# Patient Record
Sex: Female | Born: 1980 | Race: White | Hispanic: No | Marital: Married | Smoking: Never smoker
Health system: Southern US, Community
[De-identification: ages and names within clinical notes are randomized; demographics above are authoritative.]

## PROBLEM LIST (undated history)

## (undated) DIAGNOSIS — J45909 Unspecified asthma, uncomplicated: Secondary | ICD-10-CM

## (undated) HISTORY — DX: Unspecified asthma, uncomplicated: J45.909

## (undated) HISTORY — PX: TYMPANOSTOMY TUBE PLACEMENT: SHX32

---
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
## 2004-10-15 ENCOUNTER — Other Ambulatory Visit: Admission: RE | Admit: 2004-10-15 | Discharge: 2004-10-15 | Payer: Self-pay | Admitting: Obstetrics and Gynecology

## 2005-12-09 ENCOUNTER — Other Ambulatory Visit: Admission: RE | Admit: 2005-12-09 | Discharge: 2005-12-09 | Payer: Self-pay | Admitting: Obstetrics and Gynecology

## 2008-02-04 ENCOUNTER — Emergency Department (HOSPITAL_COMMUNITY): Admission: EM | Admit: 2008-02-04 | Discharge: 2008-02-04 | Payer: Self-pay | Admitting: Family Medicine

## 2010-11-13 ENCOUNTER — Telehealth: Payer: Self-pay | Admitting: Internal Medicine

## 2010-11-17 NOTE — Telephone Encounter (Signed)
Advised Pt she could be scheduled for CPE around 01/11/11.  Pt will call back to schedule.

## 2010-11-17 NOTE — Telephone Encounter (Signed)
I am happy to see her later this summer. Is that OK? Say maybe July?

## 2011-12-29 ENCOUNTER — Other Ambulatory Visit: Payer: Self-pay | Admitting: Obstetrics and Gynecology

## 2011-12-29 DIAGNOSIS — N6322 Unspecified lump in the left breast, upper inner quadrant: Secondary | ICD-10-CM

## 2011-12-31 ENCOUNTER — Ambulatory Visit
Admission: RE | Admit: 2011-12-31 | Discharge: 2011-12-31 | Disposition: A | Discharge status: Home / Self Care | Payer: BC Managed Care – PPO | Source: Ambulatory Visit | Attending: Obstetrics and Gynecology | Admitting: Obstetrics and Gynecology

## 2011-12-31 DIAGNOSIS — N6322 Unspecified lump in the left breast, upper inner quadrant: Secondary | ICD-10-CM

## 2012-01-04 ENCOUNTER — Other Ambulatory Visit: Payer: Self-pay

## 2015-01-21 ENCOUNTER — Ambulatory Visit: Payer: Self-pay | Admitting: Internal Medicine

## 2015-01-21 ENCOUNTER — Ambulatory Visit: Payer: Self-pay

## 2015-06-20 ENCOUNTER — Encounter: Payer: Self-pay | Admitting: Family Medicine

## 2015-06-20 ENCOUNTER — Ambulatory Visit (HOSPITAL_BASED_OUTPATIENT_CLINIC_OR_DEPARTMENT_OTHER)
Admission: RE | Admit: 2015-06-20 | Discharge: 2015-06-20 | Disposition: A | Discharge status: Home / Self Care | Payer: BLUE CROSS/BLUE SHIELD | Source: Ambulatory Visit | Attending: Family Medicine | Admitting: Family Medicine

## 2015-06-20 ENCOUNTER — Ambulatory Visit (INDEPENDENT_AMBULATORY_CARE_PROVIDER_SITE_OTHER): Payer: BLUE CROSS/BLUE SHIELD | Admitting: Family Medicine

## 2015-06-20 VITALS — BP 125/84 | HR 45 | Ht 66.0 in | Wt 140.0 lb

## 2015-06-20 DIAGNOSIS — M25511 Pain in right shoulder: Secondary | ICD-10-CM

## 2015-06-20 DIAGNOSIS — S4991XA Unspecified injury of right shoulder and upper arm, initial encounter: Secondary | ICD-10-CM | POA: Diagnosis not present

## 2015-06-20 NOTE — Patient Instructions (Signed)
I'm worried about your recurrent instability and a labral tear in your shoulder. Wear sling and avoid external rotation, overhead motions as much as possible. Icing, ibuprofen or aleve as needed for pain. Ok to take tylenol in addition to this. We will go ahead with an MRI arthrogram of your shoulder.

## 2015-06-24 DIAGNOSIS — S4991XA Unspecified injury of right shoulder and upper arm, initial encounter: Secondary | ICD-10-CM | POA: Insufficient documentation

## 2015-06-24 NOTE — Progress Notes (Addendum)
PCP: No primary care provider on file.  Subjective:   HPI: Patient is a 35 y.o. female here for right shoulder injury.  Patient reports about 3 weeks ago she was doing cross fit and started to get soreness, instability in right shoulder. Since that times has had several instances where the right shoulder pops out anteriorly and has to be pushed back into place. Pain level 4/10, sharp anterior and deep. Motion is limited. No prior issues with this shoulder. Is left handed. No skin changes, fever, other complaints.  No past medical history on file.  No current outpatient prescriptions on file prior to visit.   No current facility-administered medications on file prior to visit.    No past surgical history on file.  Allergies  Allergen Reactions  . Iodine   . Penicillins     Social History   Social History  . Marital Status: Single    Spouse Name: N/A  . Number of Children: N/A  . Years of Education: N/A   Occupational History  . Not on file.   Social History Main Topics  . Smoking status: Never Smoker   . Smokeless tobacco: Not on file  . Alcohol Use: Not on file  . Drug Use: Not on file  . Sexual Activity: Not on file   Other Topics Concern  . Not on file   Social History Narrative  . No narrative on file    No family history on file.  BP 125/84 mmHg  Pulse 45  Ht 5\' 6"  (1.676 m)  Wt 140 lb (63.504 kg)  BMI 22.61 kg/m2  LMP 06/15/2015  Review of Systems: See HPI above.    Objective:  Physical Exam:  Gen: NAD  Right shoulder: No swelling, ecchymoses.  No gross deformity. TTP anteriorly with less over AC joint.  No other tenderness. Abduction and flexion to 150 degrees.  Full ER and IR. Mild pain with Hawkins, negative Neers. Negative Speeds, Yergasons. Strength 5/5 with empty can and resisted internal/external rotation. Positive apprehension. Positive o'briens. Positive sulcus NV intact distally.  Left shoulder: FROM without pain.   Positive sulcus only.    Assessment & Plan:  1. Right shoulder injury - 2/2 recurrent subluxations and probable labral tear with very unstable shoulder.  Radiographs negative for bony bankart or hill-sachs lesion.  Advised given level of instability I do not think she would do well with conservative measures though offered them as an option (sling followed by physical therapy).  We will go ahead with MRI arthrogram to further assess.  Sling, icing, nsaids in meantime.  Addendum:  MRI reviewed and discussed with patient.  No evidence Bankart or labral tear.  She has torn posterior band of inferior glenohumeral ligament.  She should recover well with conservative treatment, strengthening rotator cuff.  She will start formal physical therapy and home exercises.  F/u in 4 weeks after starting this.

## 2015-06-24 NOTE — Assessment & Plan Note (Signed)
2/2 recurrent subluxations and probable labral tear with very unstable shoulder.  Radiographs negative for bony bankart or hill-sachs lesion.  Advised given level of instability I do not think she would do well with conservative measures though offered them as an option (sling followed by physical therapy).  We will go ahead with MRI arthrogram to further assess.  Sling, icing, nsaids in meantime.

## 2015-06-25 ENCOUNTER — Telehealth: Payer: Self-pay | Admitting: Family Medicine

## 2015-06-25 NOTE — Telephone Encounter (Signed)
The pain can cause nausea, yes.  But it's also possible she has two separate things going on - labral tear of her shoulder plus something else (there is a GI virus going around now for example; pregnancy can cause nausea, several other things too).

## 2015-06-26 NOTE — Telephone Encounter (Signed)
Spoke to patient on 06-25-15 and gave her information provided by physician.

## 2015-07-02 ENCOUNTER — Ambulatory Visit
Admission: RE | Admit: 2015-07-02 | Discharge: 2015-07-02 | Disposition: A | Discharge status: Home / Self Care | Payer: BLUE CROSS/BLUE SHIELD | Source: Ambulatory Visit | Attending: Family Medicine | Admitting: Family Medicine

## 2015-07-02 DIAGNOSIS — M25511 Pain in right shoulder: Secondary | ICD-10-CM

## 2015-07-07 ENCOUNTER — Other Ambulatory Visit: Payer: BLUE CROSS/BLUE SHIELD

## 2015-07-07 ENCOUNTER — Telehealth: Payer: Self-pay | Admitting: Family Medicine

## 2015-07-07 MED ORDER — NITROGLYCERIN 0.2 MG/HR TD PT24: MEDICATED_PATCH | TRANSDERMAL | Status: DC

## 2015-07-07 NOTE — Telephone Encounter (Signed)
Ok - referral placed by [employer] and signed.  Thanks!

## 2015-07-07 NOTE — Telephone Encounter (Signed)
Spoke to patient and notified her that prescription (Nitro) has been sent to her pharmacy.

## 2015-07-07 NOTE — Telephone Encounter (Signed)
Nitro sent. Please notify patient.  Thanks!

## 2015-09-04 ENCOUNTER — Ambulatory Visit (INDEPENDENT_AMBULATORY_CARE_PROVIDER_SITE_OTHER): Payer: BLUE CROSS/BLUE SHIELD | Admitting: Family Medicine

## 2015-09-04 VITALS — BP 106/62 | HR 52 | Ht 66.0 in | Wt 140.0 lb

## 2015-09-04 DIAGNOSIS — M25462 Effusion, left knee: Secondary | ICD-10-CM | POA: Diagnosis not present

## 2015-09-04 NOTE — Patient Instructions (Signed)
You have a synovitis of your knee. It's very unlikely for you have a meniscus tear in your knee based on your history and exam. Icing 15 minutes at a time 3-4 times a day. ACE wrap or compression sleeve. Elevate above your heart as much as possible. Ibuprofen 800mg  three times a day with food OR aleve 2 tabs twice a day with food for pain and inflammation for 7-10 days then as needed. Avoid deep squats, deep lunges, leg press. Consider waiting 2 weeks before running as this has been bothering it. If not improving would consider aspiration and injection of your knee. Follow up with me in 1 month but call me sooner with any questions or concerns.

## 2015-09-10 ENCOUNTER — Encounter: Payer: Self-pay | Admitting: Family Medicine

## 2015-09-10 DIAGNOSIS — M25462 Effusion, left knee: Secondary | ICD-10-CM | POA: Insufficient documentation

## 2015-09-10 NOTE — Assessment & Plan Note (Signed)
exam reassuring.  Consistent with simple synovitis of the knee.  No redness, minimal pain and full motion.  Doubt gout, rheumatoid arthritis as causes.  Encouraged continued conservative treatment - icing, ace wrap, elevation, regular nsaids.  F/u in 1 month.  Consider aspiration and injection if not improving over next couple of weeks though.

## 2015-09-10 NOTE — Progress Notes (Signed)
PCP: No primary care provider on file.  Subjective:   HPI: Patient is a 35 y.o. female here for left knee pain.  Patient denies known injury or trauma. Started to notice anterior left knee pain and swelling after a run 3 weeks ago. Has had some soreness in knees past couple years off and on but never needed treatment. Denies clicking, catching, locking, giving out. Pain can be sharp, up to 5/10 level. Has been icing, elevating, taking ibuprofen. No skin changes, fever.  No past medical history on file.  Current Outpatient Prescriptions on File Prior to Visit  Medication Sig Dispense Refill  . albuterol (PROVENTIL HFA;VENTOLIN HFA) 108 (90 Base) MCG/ACT inhaler Inhale into the lungs every 6 (six) hours as needed for wheezing or shortness of breath.    . nitroGLYCERIN (NITRODUR - DOSED IN MG/24 HR) 0.2 mg/hr patch Apply 1/4th patch to affected shoulder, change daily 30 patch 1   No current facility-administered medications on file prior to visit.    No past surgical history on file.  Allergies  Allergen Reactions  . Iodine   . Penicillins     Social History   Social History  . Marital Status: Single    Spouse Name: N/A  . Number of Children: N/A  . Years of Education: N/A   Occupational History  . Not on file.   Social History Main Topics  . Smoking status: Never Smoker   . Smokeless tobacco: Not on file  . Alcohol Use: Not on file  . Drug Use: Not on file  . Sexual Activity: Not on file   Other Topics Concern  . Not on file   Social History Narrative    No family history on file.  BP 106/62 mmHg  Pulse 52  Ht 5\' 6"  (1.676 m)  Wt 140 lb (63.504 kg)  BMI 22.61 kg/m2  Review of Systems: See HPI above.    Objective:  Physical Exam:  Gen: NAD, comfortable in exam room  Left knee: Mod effusion.  No other deformity, bruising. No TTP. FROM. Negative ant/post drawers. Negative valgus/varus testing. Negative lachmanns. Negative mcmurrays, apleys,  patellar apprehension. NV intact distally.  Right knee: FROM without pain.    Assessment & Plan:  1. Left knee effusion, pain - exam reassuring.  Consistent with simple synovitis of the knee.  No redness, minimal pain and full motion.  Doubt gout, rheumatoid arthritis as causes.  Encouraged continued conservative treatment - icing, ace wrap, elevation, regular nsaids.  F/u in 1 month.  Consider aspiration and injection if not improving over next couple of weeks though.

## 2017-03-13 IMAGING — DX DG SHOULDER 2+V*R*
3 series · 3 of 3 positions shown · non-contrast
Comparison: None.

CLINICAL DATA: 34-year-old female with right shoulder pain for 1
month following exercise related injury. Initial encounter.

EXAM:
RIGHT SHOULDER - 2+ VIEW

[shoulder grashey]
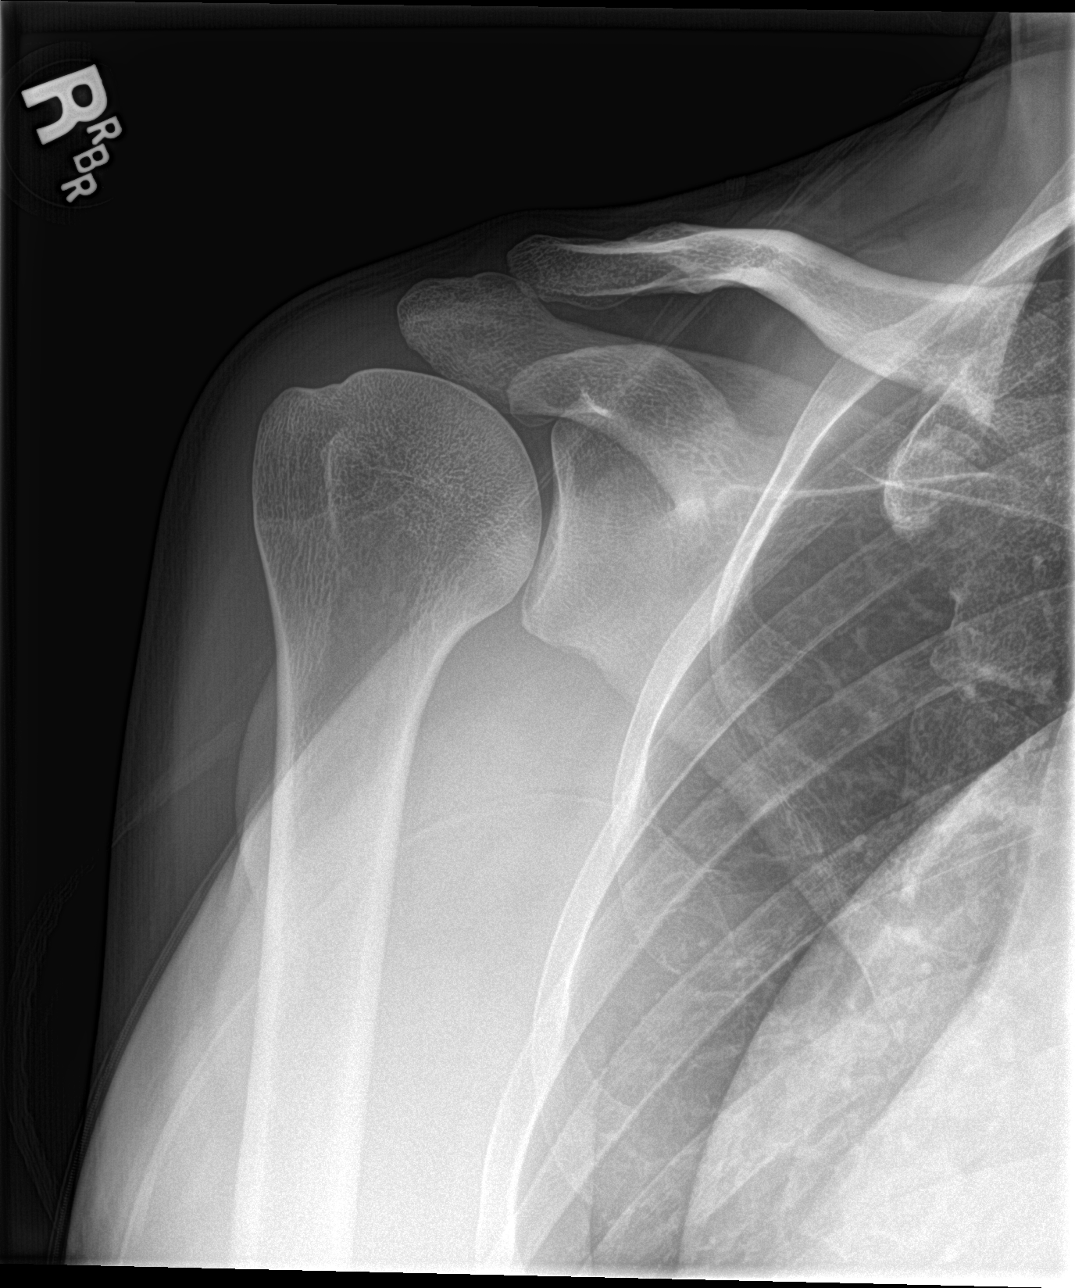

[shoulder axillary]
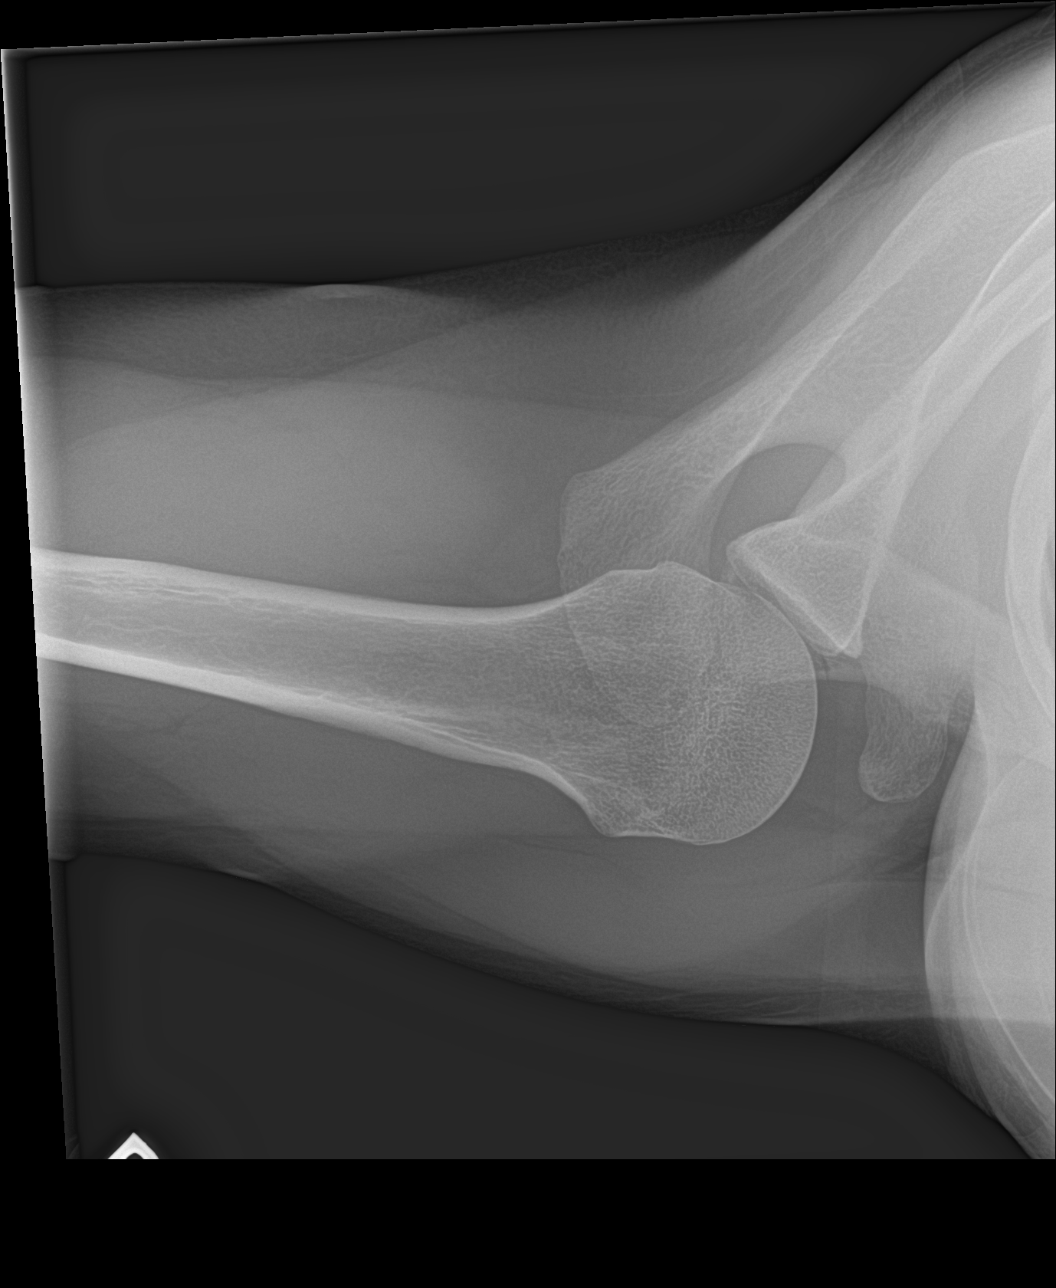

[shoulder y view]
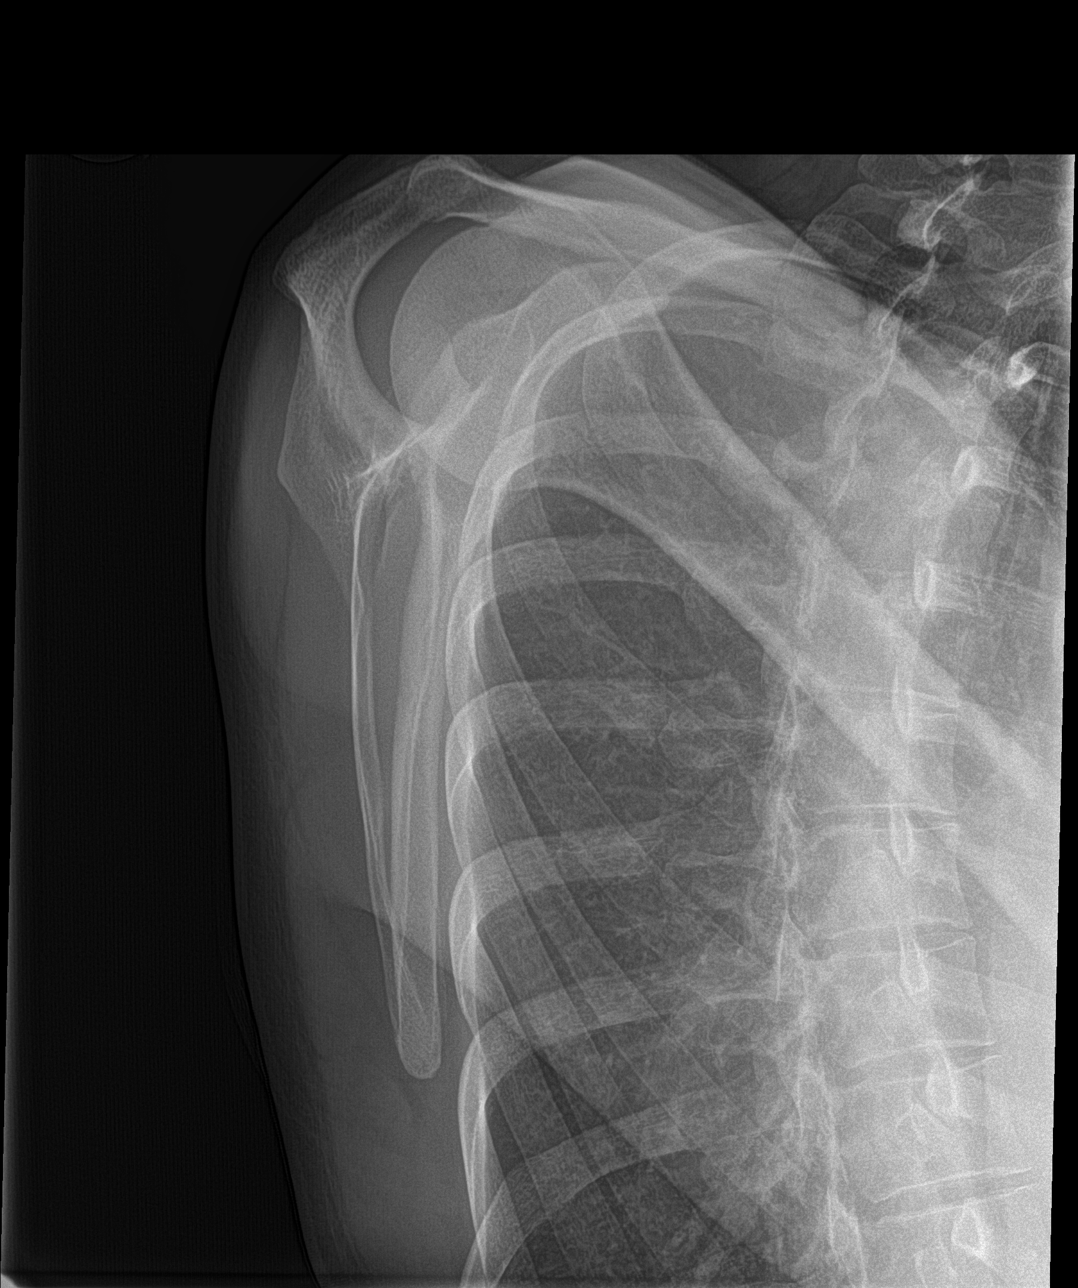

[3 of 3 positions shown; findings below may reference images not displayed]

FINDINGS: Bone mineralization is within normal limits. No glenohumeral joint
dislocation. Proximal right humerus intact. Visible right clavicle
and scapula appear intact. Negative visualized right ribs and lung
parenchyma. Incidental breast implants.
IMPRESSION: No osseous abnormality identified about the right shoulder.

## 2018-12-20 ENCOUNTER — Ambulatory Visit (HOSPITAL_BASED_OUTPATIENT_CLINIC_OR_DEPARTMENT_OTHER)
Admission: RE | Admit: 2018-12-20 | Discharge: 2018-12-20 | Disposition: A | Discharge status: Home / Self Care | Payer: PRIVATE HEALTH INSURANCE | Source: Ambulatory Visit | Attending: Family Medicine | Admitting: Family Medicine

## 2018-12-20 ENCOUNTER — Other Ambulatory Visit: Payer: Self-pay

## 2018-12-20 ENCOUNTER — Ambulatory Visit (INDEPENDENT_AMBULATORY_CARE_PROVIDER_SITE_OTHER): Payer: No Typology Code available for payment source | Admitting: Family Medicine

## 2018-12-20 ENCOUNTER — Encounter: Payer: Self-pay | Admitting: Family Medicine

## 2018-12-20 VITALS — BP 126/77 | HR 56 | Ht 65.0 in | Wt 140.0 lb

## 2018-12-20 DIAGNOSIS — M25552 Pain in left hip: Secondary | ICD-10-CM | POA: Diagnosis not present

## 2018-12-20 NOTE — Patient Instructions (Signed)
Nice to meet you Please try the exercises and stretches  I will call you with the results from today   Please send me a message in [place] with any questions or updates.  Please see me back in 4 weeks.   --Dr. Raeford Razor

## 2018-12-20 NOTE — Progress Notes (Signed)
Elaine Dorsey - 38 y.o. female MRN [id number]  Date of birth: May 21, 1981  SUBJECTIVE:  Including CC & ROS.  Chief Complaint  Patient presents with  . Hip Pain    left hip  . Back Pain    left-sided low back    Elaine Dorsey is a 38 y.o. female that is presenting with acute on chronic left hip pain.  She feels the pain over the left lateral hip as well as the lower back.  This is been ongoing for over a year now.  She feels the pain is becoming more intense and frequent.  She works out on a regular basis.  She does running as well as lifting weights.  She feels the pain the next day after running in this area.  Denies any specific inciting event or trauma.  Has not taken any medication.  The pain is becoming more constant.  The pain can be sharp.   Review of Systems  Constitutional: Negative for fever.  HENT: Negative for congestion.   Respiratory: Negative for cough.   Cardiovascular: Negative for chest pain.  Gastrointestinal: Negative for abdominal pain.  Musculoskeletal: Positive for back pain. Negative for gait problem.  Skin: Negative for color change.  Neurological: Negative for weakness.  Hematological: Negative for adenopathy.    HISTORY: Past Medical, Surgical, Social, and Family History Reviewed & Updated per EMR.   Pertinent Historical Findings include:  History reviewed. No pertinent past medical history.  History reviewed. No pertinent surgical history.  Allergies  Allergen Reactions  . Iodine   . Penicillins     History reviewed. No pertinent family history.   Social History   Socioeconomic History  . Marital status: Single    Spouse name: Not on file  . Number of children: Not on file  . Years of education: Not on file  . Highest education level: Not on file  Occupational History  . Not on file  Social Needs  . Financial resource strain: Not on file  . Food insecurity    Worry: Not on file    Inability: Not on file  .  Transportation needs    Medical: Not on file    Non-medical: Not on file  Tobacco Use  . Smoking status: Never Smoker  . Smokeless tobacco: Never Used  Substance and Sexual Activity  . Alcohol use: Not on file  . Drug use: Not on file  . Sexual activity: Not on file  Lifestyle  . Physical activity    Days per week: Not on file    Minutes per session: Not on file  . Stress: Not on file  Relationships  . Social Musicianconnections    Talks on phone: Not on file    Gets together: Not on file    Attends religious service: Not on file    Active member of club or organization: Not on file    Attends meetings of clubs or organizations: Not on file    Relationship status: Not on file  . Intimate partner violence    Fear of current or ex partner: Not on file    Emotionally abused: Not on file    Physically abused: Not on file    Forced sexual activity: Not on file  Other Topics Concern  . Not on file  Social History Narrative  . Not on file     PHYSICAL EXAM:  VS: BP 126/77   Pulse (!) 56   Ht 5\' 5"  (1.651 m)  Wt 140 lb (63.5 kg)   LMP 12/06/2018   BMI 23.30 kg/m  Physical Exam Gen: NAD, alert, cooperative with exam, well-appearing ENT: normal lips, normal nasal mucosa,  Eye: normal EOM, normal conjunctiva and lids CV:  no edema, +2 pedal pulses   Resp: no accessory muscle use, non-labored,   Skin: no rashes, no areas of induration  Neuro: normal tone, normal sensation to touch Psych:  normal insight, alert and oriented MSK:  Back/Left hip:  No leg length discrepancy. Normal internal and external rotation. Normal hip abduction strength. Negative straight leg raise. No snapping of the hip. TTP of the Si joint  Neurovascularly intact      ASSESSMENT & PLAN:   I spent 25 minutes with this patient, greater than 50% was face-to-face time counseling regarding the below diagnosis.   Left hip pain She has good strength with hip abduction.  Possible that she may have a  pincer or cam lesion.  Could be associated with the iliopsoas.  No specific injury but could be labral related. -X-ray - counseled on training  -Counseled on home exercise therapy and supportive care. -If no improvement consider physical therapy or injection.

## 2018-12-20 NOTE — Assessment & Plan Note (Addendum)
She has good strength with hip abduction.  Possible that she may have a pincer or cam lesion.  Could be associated with the iliopsoas.  No specific injury but could be labral related. -X-ray - counseled on training  -Counseled on home exercise therapy and supportive care. -If no improvement consider physical therapy or injection.

## 2018-12-21 ENCOUNTER — Telehealth: Payer: Self-pay | Admitting: Family Medicine

## 2018-12-21 NOTE — Addendum Note (Signed)
Addended by: Sherrie George F on: 12/21/2018 08:46 AM   Modules accepted: Orders

## 2018-12-21 NOTE — Telephone Encounter (Signed)
Left VM for patient. If she calls back please have her  speak with a nurse/CMA and inform that her xrays do not show any significant changes. I think giving physical therapy a try would be the next best step.   If any questions then please take the best time and phone number to call and I will try to call her back.   Rosemarie Ax, MD [facility] 12/21/2018, 8:20 AM

## 2019-09-14 ENCOUNTER — Ambulatory Visit: Payer: No Typology Code available for payment source | Attending: Internal Medicine

## 2019-09-14 DIAGNOSIS — Z23 Encounter for immunization: Secondary | ICD-10-CM

## 2019-09-14 NOTE — Progress Notes (Signed)
   Covid-19 Vaccination Clinic  Name:  Elaine Dorsey    MRN: [id number] DOB: Nov 17, 1980  09/14/2019  [name] was observed post Covid-19 immunization for 30 minutes based on pre-vaccination screening without incident. She was provided with Vaccine Information Sheet and instruction to access the V-Safe system.   [name] was instructed to call 911 with any severe reactions post vaccine: Marland Kitchen Difficulty breathing  . Swelling of face and throat  . A fast heartbeat  . A bad rash all over body  . Dizziness and weakness   Immunizations Administered    Name Date Dose VIS Date Route   Pfizer COVID-19 Vaccine 09/14/2019 12:08 PM 0.3 mL 06/01/2019 Intramuscular   Manufacturer: [employer], [employer]   Lot: [id number]   NDC: [address]-0

## 2019-10-08 ENCOUNTER — Ambulatory Visit: Payer: No Typology Code available for payment source | Attending: Internal Medicine

## 2019-10-08 DIAGNOSIS — Z23 Encounter for immunization: Secondary | ICD-10-CM

## 2019-10-08 NOTE — Progress Notes (Signed)
   Covid-19 Vaccination Clinic  Name:  Elaine Dorsey    MRN: [id number] DOB: 1981-04-18  10/08/2019  [name] was observed post Covid-19 immunization for 30 minutes based on pre-vaccination screening without incident. She was provided with Vaccine Information Sheet and instruction to access the V-Safe system.   [name] was instructed to call 911 with any severe reactions post vaccine: Marland Kitchen Difficulty breathing  . Swelling of face and throat  . A fast heartbeat  . A bad rash all over body  . Dizziness and weakness   Immunizations Administered    Name Date Dose VIS Date Route   Pfizer COVID-19 Vaccine 10/08/2019  4:24 PM 0.3 mL 08/15/2018 Intramuscular   Manufacturer: [employer], [employer]   Lot: [id number]   NDC: [address]-7

## 2020-02-15 ENCOUNTER — Other Ambulatory Visit: Payer: Self-pay | Admitting: Obstetrics and Gynecology

## 2020-02-15 DIAGNOSIS — N979 Female infertility, unspecified: Secondary | ICD-10-CM

## 2020-03-11 ENCOUNTER — Other Ambulatory Visit: Payer: No Typology Code available for payment source

## 2020-03-11 DIAGNOSIS — Z20822 Contact with and (suspected) exposure to covid-19: Secondary | ICD-10-CM

## 2020-03-13 LAB — SARS-COV-2, NAA 2 DAY TAT

## 2020-03-13 LAB — NOVEL CORONAVIRUS, NAA: SARS-CoV-2, NAA: NOT DETECTED

## 2020-04-11 ENCOUNTER — Telehealth: Payer: Self-pay

## 2020-04-11 NOTE — Telephone Encounter (Signed)
Received call from scheduling 04/07/20 to call in 13 hour prep for patient for HSG procedure on 04/14/20. 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 Per Dr. Eppie Gibson he was okay with the 13 hour prep as the contrast is injected into a local area not given through an IV. Spoke with patient on 10/18 regarding this and the pt did not feel comfortable receiving contrast at all, even with the 13 hour prep. Pt reported she would discuss with her OB and call [facility] back. Pt called today (04/11/20) to follow up and pt requested the appt/procedure to be canceled. [facility], in scheduling, canceled procedure.

## 2020-04-14 ENCOUNTER — Other Ambulatory Visit: Payer: No Typology Code available for payment source

## 2020-09-12 IMAGING — DX DG HIP (WITH OR WITHOUT PELVIS) 2-3V LEFT
3 series · 3 of 3 positions shown · non-contrast
Comparison: None.

CLINICAL DATA: Low back and left hip and leg pain.

EXAM:
DG HIP (WITH OR WITHOUT PELVIS) 2-3V LEFT

[pelvis ap]
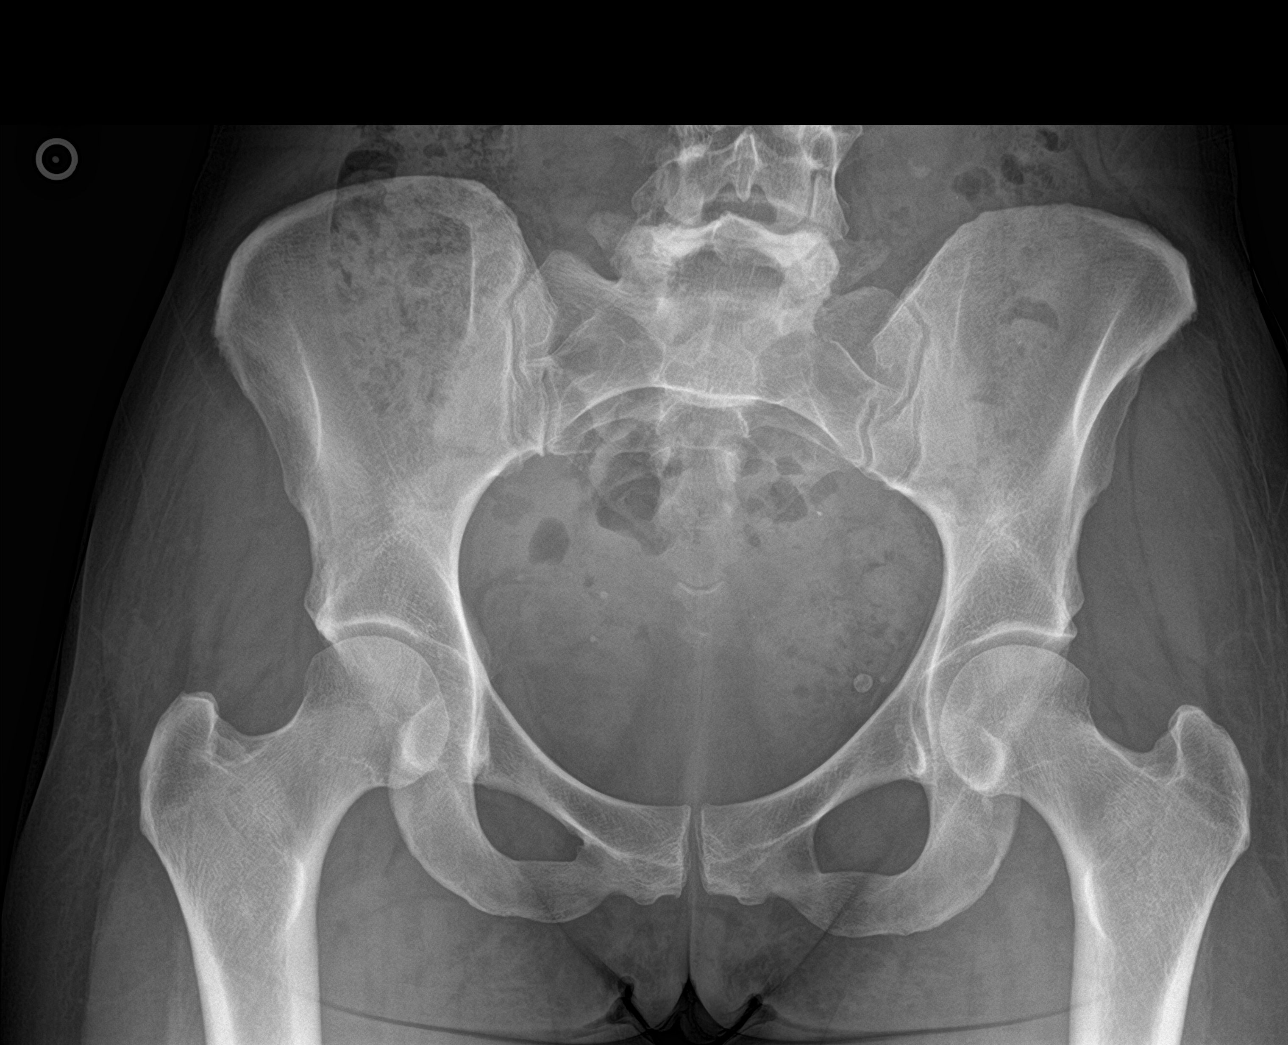

[hip ap]
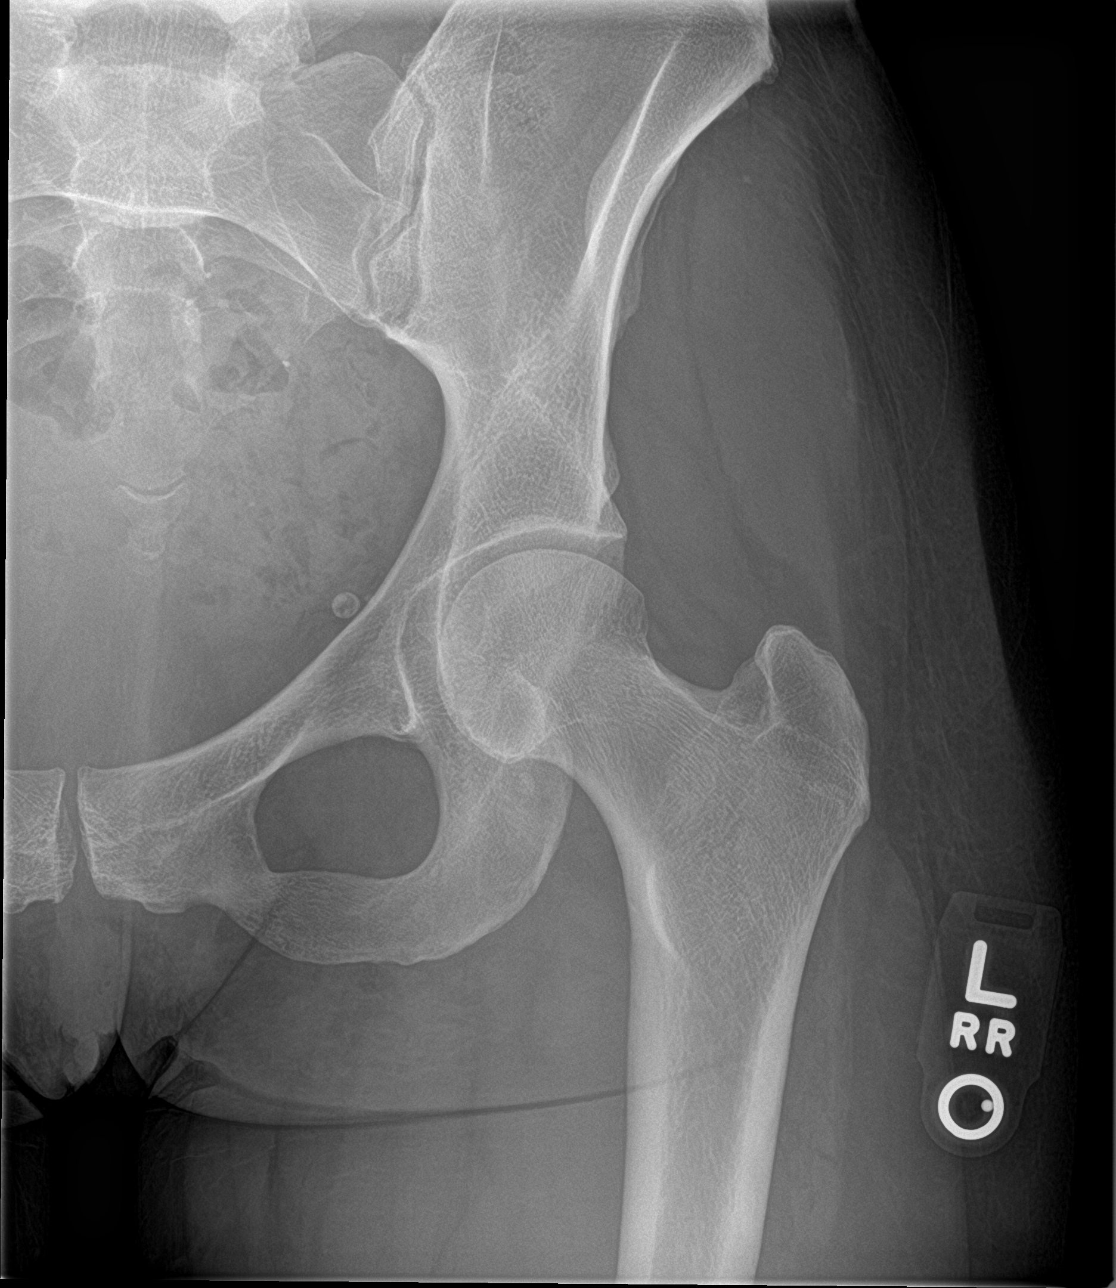

[hip lat]
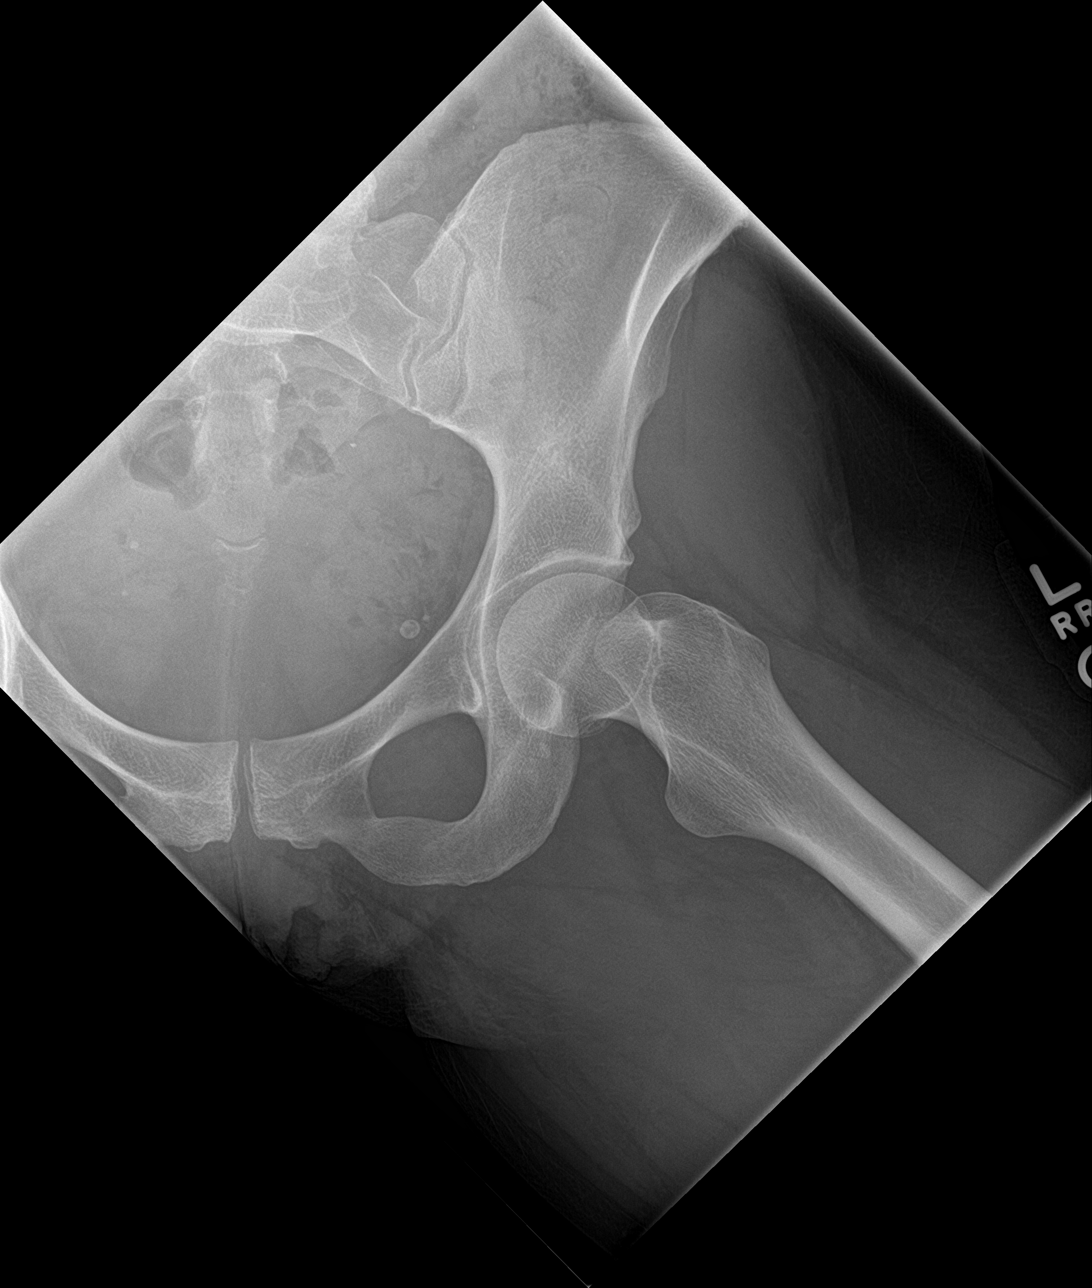

[3 of 3 positions shown; findings below may reference images not displayed]

FINDINGS: Lumbar curvature convex to the left. Lower lumbar degenerative
changes. No abnormality of the sacroiliac joints or pelvis. Left hip
joint appears normal.
IMPRESSION: No pelvic or left hip abnormality seen. Lumbar curvature and lower
lumbar degenerative changes.

## 2022-10-04 ENCOUNTER — Encounter: Payer: Self-pay | Admitting: *Deleted

## 2022-10-18 NOTE — Progress Notes (Unsigned)
New Patient Note  RE: Elaine Dorsey MRN: [id number] DOB: 05/19/1981 Date of Office Visit: 10/19/2022  Consult requested by: No ref. provider found Primary care provider: [facility]  Chief Complaint: Cough (Pt states it happens more at the end of the day she states she has had asthma since a child.)  History of Present Illness: I had the pleasure of seeing Elaine Dorsey for initial evaluation at the [facility] on 10/19/2022. She is a 42 y.o. female, who is referred here by [facility] for the evaluation of asthma and allergic rhinitis.  Asthma She reports symptoms of chest tightness, shortness of breath, coughing, nocturnal awakenings for many years. Symptoms wax and wane throughout the years.  Patient noticed a flare in her symptoms the past few months. Not sure what's triggering it. Sometimes worse at work - she owns [employer] but also had to use albuterol when at home.   Apparently her husband mentions that she is always dry coughing though.   Current medications include albuterol 3 puffs prn which help. She reports not using aerochamber with inhalers. She tried the following inhalers: tried ICS inhaler as a child.   In the last month, frequency of symptoms: daily. Frequency of nocturnal symptoms: 0x/month. Frequency of SABA use: 2-3 times per day. Interference with physical activity: no. Sleep is undisturbed. In the last 12 months, emergency room visits/urgent care visits/doctor office visits or hospitalizations due to respiratory issues: once. In the last 12 months, oral steroids courses: no. Lifetime history of hospitalization for respiratory issues: no. Prior intubations: no. History of pneumonia: no. She was evaluated by allergist in the past.   Smoking exposure: no. Up to date with flu vaccine: no. Up to date with COVID-19 vaccine: yes. Prior Covid-19 infection: January  2022. History of reflux: yes in the past.  Rhinitis:  She reports symptoms of nasal congestion, rhinorrhea, sneezing, itchy/watery eyes. Symptoms have been going on for 2-3 years. The symptoms are present all year around with worsening in spring. Anosmia: no. Headache: yes. She has used zyrtec with some improvement in symptoms. Takes Flonase prn. Sinus infections: no. Previous work up includes: skin testing over 10 years ago was positive to multiple items. No prior allergy injections.   Previous ENT evaluation: not recently Previous sinus imaging: no. History of nasal polyps: no. Last eye exam: 1+ year ago.  Assessment and Plan: Elaine Dorsey is a 42 y.o. female with: Not well controlled moderate persistent asthma Diagnosed as a child and symptoms flared the past few months. Using albuterol 3 puffs 2-3 times per day with some benefit. Works at [employer] and not sure if the air there is making her symptoms worse. History of GERD and has environmental allergies.  Today's spirometry showed: normal pattern with no improvement in FEV1 post bronchodilator treatment. Clinically feeling slightly improved.  Daily controller medication(s): start Symbicort 2 puffs twice a day with spacer and rinse mouth afterwards. Spacer given and demonstrated proper use with inhaler. Patient understood technique and all questions/concerned were addressed.  If not covered or too expensive let us know.  May use albuterol rescue inhaler 2-4 puffs every 4 to 6 hours as needed for shortness of breath, chest tightness, coughing, and wheezing. Monitor frequency of use.  Get spirometry at next visit.  Other allergic rhinitis Perennial rhino conjunctivitis symptoms which flare in the spring.  Taking Zyrtec with some benefit.  Takes Flonase as needed.  Skin testing  over 10 years ago showed multiple positives.  No prior AIT.  1 cat and 1 dog at home. Unable to skin test today as [facility] won't allow new patient visits and  procedures on the same day.  Plan on testing at next visit.  Use over the counter antihistamines such as Zyrtec (cetirizine), Claritin (loratadine), Allegra (fexofenadine), or Xyzal (levocetirizine) daily as needed. May take twice a day during allergy flares. May switch antihistamines every few months.  Food allergy - shellfish Allergic reaction to shellfish at age 80 requiring ER visit.  Tolerates finned fish with no issues.  Unclear as to when her last testing was to this. Continue to avoid shellfish. For mild symptoms you can take over the counter antihistamines such as Benadryl and monitor symptoms closely. If symptoms worsen or if you have severe symptoms including breathing issues, throat closure, significant swelling, whole body hives, severe diarrhea and vomiting, lightheadedness then seek immediate medical care. Plan on testing at next visit.   Penicillin allergy Continue to avoid.   Return in about 2 months (around 12/19/2022) for Skin testing.  Meds ordered this encounter  Medications   budesonide-formoterol (SYMBICORT) 80-4.5 MCG/ACT inhaler    Sig: Inhale 2 puffs into the lungs in the morning and at bedtime. with spacer and rinse mouth afterwards.    Dispense:  1 each    Refill:  3   Lab Orders  No laboratory test(s) ordered today    Other allergy screening: Food allergy:  currently avoiding shellfish.   At age 57 she had allergic reaction reaction requiring ER visit - throat tightness and trouble breathing. Tolerates finned fish with no issues.   Past work up includes: not sure. Apparently her chiropractor did some bloodwork in the past for food sensitivity.  Dietary History: patient has been eating other foods including milk, limited eggs, peanut, treenuts, sesame, fish, soy, wheat, meats, fruits and vegetables.  She reports reading labels and avoiding shellfish in diet completely.  Patient has Epipen and no prior use.   Medication allergy: yes Iodine washed  fruits/vegetables caused perioral pruritus.  No prior IV contrast.   Hymenoptera allergy: no Urticaria: no Eczema:no History of recurrent infections suggestive of immunodeficency: no  Diagnostics: Spirometry:  Tracings reviewed. Her effort: Good reproducible efforts. FVC: 3.79L FEV1: 2.86L, 87% predicted FEV1/FVC ratio: 75% Interpretation: Spirometry consistent with normal pattern with no improvement in FEV1 post bronchodilator treatment. Clinically feeling slightly improved.   Please see scanned spirometry results for details.  Skin Testing: Unable to skin test today as [facility] won't allow new patient visits and procedures on the same day.   Results discussed with patient/family.  Past Medical History: Patient Active Problem List   Diagnosis Date Noted   Not well controlled moderate persistent asthma 10/19/2022   Penicillin allergy 10/19/2022   Food allergy - shellfish 10/19/2022   Other allergic rhinitis 10/19/2022   Left hip pain 12/20/2018   Effusion of left knee 09/10/2015   Right shoulder injury 06/24/2015   Past Medical History:  Diagnosis Date   Asthma    Past Surgical History: Past Surgical History:  Procedure Laterality Date   TYMPANOSTOMY TUBE PLACEMENT     Medication List:  Current Outpatient Medications  Medication Sig Dispense Refill   albuterol (PROVENTIL HFA;VENTOLIN HFA) 108 (90 Base) MCG/ACT inhaler Inhale into the lungs every 6 (six) hours as needed for wheezing or shortness of breath.     budesonide-formoterol (SYMBICORT) 80-4.5 MCG/ACT inhaler Inhale 2 puffs into the lungs in the morning  and at bedtime. with spacer and rinse mouth afterwards. 1 each 3   No current facility-administered medications for this visit.   Allergies: Allergies  Allergen Reactions   Iodine Anaphylaxis   Shellfish Allergy Anaphylaxis   Penicillins    Social History: Social History   Socioeconomic History   Marital status: Married    Spouse name: Not on file    Number of children: Not on file   Years of education: Not on file   Highest education level: Not on file  Occupational History   Not on file  Tobacco Use   Smoking status: Never   Smokeless tobacco: Never  Substance and Sexual Activity   Alcohol use: Not on file   Drug use: Not on file   Sexual activity: Not on file  Other Topics Concern   Not on file  Social History Narrative   Not on file   Social Determinants of Health   Financial Resource Strain: Not on file  Food Insecurity: Not on file  Transportation Needs: Not on file  Physical Activity: Not on file  Stress: Not on file  Social Connections: Not on file   Lives in a house which was built in 1994. Smoking: denies Occupation: Cabin crew History: Water Damage/mildew in the house: yes Carpet in the family room: yes Carpet in the bedroom: yes Heating: gas Cooling: central Pet: yes 1 cat x 3 yrs, 1 dog x 1.5 yrs  Family History: Family History  Problem Relation Age of Onset   Allergic rhinitis Father    Review of Systems  Constitutional:  Negative for appetite change, chills, fever and unexpected weight change.  HENT:  Negative for congestion and rhinorrhea.   Eyes:  Negative for itching.  Respiratory:  Positive for cough, chest tightness and shortness of breath. Negative for wheezing.   Cardiovascular:  Negative for chest pain.  Gastrointestinal:  Negative for abdominal pain.  Genitourinary:  Negative for difficulty urinating.  Skin:  Negative for rash.  Allergic/Immunologic: Positive for environmental allergies and food allergies.  Neurological:  Positive for headaches.    Objective: BP 122/70   Pulse 85   Temp 98 F (36.7 C)   Resp 20   Ht 5\' 7"  (1.702 m)   Wt 147 lb (66.7 kg)   SpO2 100%   BMI 23.02 kg/m  Body mass index is 23.02 kg/m. Physical Exam Vitals and nursing note reviewed.  Constitutional:      Appearance: Normal appearance. She is well-developed.  HENT:      Head: Normocephalic and atraumatic.     Right Ear: Tympanic membrane and external ear normal.     Left Ear: Tympanic membrane and external ear normal.     Nose: Nose normal.     Mouth/Throat:     Mouth: Mucous membranes are moist.     Pharynx: Oropharynx is clear.  Eyes:     Conjunctiva/sclera: Conjunctivae normal.  Cardiovascular:     Rate and Rhythm: Normal rate and regular rhythm.     Heart sounds: Normal heart sounds. No murmur heard.    No friction rub. No gallop.  Pulmonary:     Effort: Pulmonary effort is normal.     Breath sounds: Normal breath sounds. No wheezing, rhonchi or rales.  Musculoskeletal:     Cervical back: Neck supple.  Skin:    General: Skin is warm.     Findings: No rash.  Neurological:     Mental Status: She is alert and  oriented to person, place, and time.  Psychiatric:        Behavior: Behavior normal.    The plan was reviewed with the patient/family, and all questions/concerned were addressed.  It was my pleasure to see Evangaline today and participate in her care. Please feel free to contact me with any questions or concerns.  Sincerely,  Wyline Mood, DO Allergy & Immunology  [facility] office: [phone] [facility] office: [phone]

## 2022-10-19 ENCOUNTER — Ambulatory Visit (INDEPENDENT_AMBULATORY_CARE_PROVIDER_SITE_OTHER): Payer: No Typology Code available for payment source | Admitting: Allergy

## 2022-10-19 ENCOUNTER — Encounter: Payer: Self-pay | Admitting: Allergy

## 2022-10-19 ENCOUNTER — Other Ambulatory Visit: Payer: Self-pay

## 2022-10-19 VITALS — BP 122/70 | HR 85 | Temp 98.0°F | Resp 20 | Ht 67.0 in | Wt 147.0 lb

## 2022-10-19 DIAGNOSIS — T7800XD Anaphylactic reaction due to unspecified food, subsequent encounter: Secondary | ICD-10-CM | POA: Insufficient documentation

## 2022-10-19 DIAGNOSIS — Z880 Allergy status to penicillin: Secondary | ICD-10-CM | POA: Diagnosis not present

## 2022-10-19 DIAGNOSIS — J4540 Moderate persistent asthma, uncomplicated: Secondary | ICD-10-CM | POA: Diagnosis not present

## 2022-10-19 DIAGNOSIS — J3089 Other allergic rhinitis: Secondary | ICD-10-CM | POA: Diagnosis not present

## 2022-10-19 MED ORDER — BUDESONIDE-FORMOTEROL FUMARATE 80-4.5 MCG/ACT IN AERO: 2.0000 | INHALATION_SPRAY | Freq: Two times a day (BID) | RESPIRATORY_TRACT | 3 refills | Status: DC

## 2022-10-19 NOTE — Assessment & Plan Note (Signed)
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  Taking Zyrtec with some benefit.  Takes Flonase as needed.  Skin testing over 10 years ago showed multiple positives.  No prior AIT.  1 cat and 1 dog at home. Unable to skin test today as [facility] won't allow new patient visits and procedures on the same day.  Plan on testing at next visit.  Use over the counter antihistamines such as Zyrtec (cetirizine), Claritin (loratadine), Allegra (fexofenadine), or Xyzal (levocetirizine) daily as needed. May take twice a day during allergy flares. May switch antihistamines every few months.

## 2022-10-19 NOTE — Patient Instructions (Addendum)
Asthma:  Daily controller medication(s): start Symbicort 2 puffs twice a day with spacer and rinse mouth afterwards. Spacer given and demonstrated proper use with inhaler. Patient understood technique and all questions/concerned were addressed.  If not covered or too expensive let us know.  May use albuterol rescue inhaler 2-4 puffs every 4 to 6 hours as needed for shortness of breath, chest tightness, coughing, and wheezing. Monitor frequency of use.  Breathing control goals:  Full participation in all desired activities (may need albuterol before activity) Albuterol use two times or less a week on average (not counting use with activity) Cough interfering with sleep two times or less a month Oral steroids no more than once a year No hospitalizations   Environmental allergies Unable to skin test today as [facility] won't allow new patient visits and procedures on the same day.  Use over the counter antihistamines such as Zyrtec (cetirizine), Claritin (loratadine), Allegra (fexofenadine), or Xyzal (levocetirizine) daily as needed. May take twice a day during allergy flares. May switch antihistamines every few months.  Food allergies Continue to avoid shellfish. For mild symptoms you can take over the counter antihistamines such as Benadryl and monitor symptoms closely. If symptoms worsen or if you have severe symptoms including breathing issues, throat closure, significant swelling, whole body hives, severe diarrhea and vomiting, lightheadedness then seek immediate medical care.  Follow up in 2 months or sooner if needed. Will plan on allergy testing at that visit - no antihistamines for 3 days before.

## 2022-10-19 NOTE — Assessment & Plan Note (Signed)
Continue to avoid

## 2022-10-19 NOTE — Assessment & Plan Note (Signed)
Diagnosed as a child and symptoms flared the past few months. Using albuterol 3 puffs 2-3 times per day with some benefit. Works at [employer] and not sure if the air there is making her symptoms worse. History of GERD and has environmental allergies.  Today's spirometry showed: normal pattern with no improvement in FEV1 post bronchodilator treatment. Clinically feeling slightly improved.  Daily controller medication(s): start Symbicort 2 puffs twice a day with spacer and rinse mouth afterwards. Spacer given and demonstrated proper use with inhaler. Patient understood technique and all questions/concerned were addressed.  If not covered or too expensive let us know.  May use albuterol rescue inhaler 2-4 puffs every 4 to 6 hours as needed for shortness of breath, chest tightness, coughing, and wheezing. Monitor frequency of use.  Get spirometry at next visit.

## 2022-10-19 NOTE — Assessment & Plan Note (Signed)
Allergic reaction to shellfish at age 42 requiring ER visit.  Tolerates finned fish with no issues.  Unclear as to when her last testing was to this. Continue to avoid shellfish. For mild symptoms you can take over the counter antihistamines such as Benadryl and monitor symptoms closely. If symptoms worsen or if you have severe symptoms including breathing issues, throat closure, significant swelling, whole body hives, severe diarrhea and vomiting, lightheadedness then seek immediate medical care. Plan on testing at next visit.

## 2022-12-28 ENCOUNTER — Ambulatory Visit: Payer: No Typology Code available for payment source | Admitting: Allergy

## 2023-01-04 ENCOUNTER — Ambulatory Visit: Payer: No Typology Code available for payment source | Admitting: Family

## 2023-01-17 NOTE — Patient Instructions (Incomplete)
Moderate persistent asthma: Controlled Daily controller medication(s): continue Symbicort 2 puffs twice a day with spacer and rinse mouth afterwards. May use albuterol rescue inhaler 2-4 puffs every 4 to 6 hours as needed for shortness of breath, chest tightness, coughing, and wheezing. Monitor frequency of use.  Breathing control goals:  Full participation in all desired activities (may need albuterol before activity) Albuterol use two times or less a week on average (not counting use with activity) Cough interfering with sleep two times or less a month Oral steroids no more than once a year No hospitalizations   Allergic rhinitis-not well controlled Skin testing today is positive to grass pollen, ragweed mix, tree pollen, mold, dust mite, cat, dog, and horse with adequate controls Copy of skin test given. Start avoidance measures as below Use over the counter antihistamines such as Zyrtec (cetirizine), Claritin (loratadine), Allegra (fexofenadine), or Xyzal (levocetirizine) daily as needed. May take twice a day during allergy flares. May switch antihistamines every few months.  Consider allergy injections as a means of long-term control. - Allergy injections "re-train" and "reset" the immune system to ignore environmental allergens and decrease the resulting immune response to those allergens (sneezing, itchy watery eyes, runny nose, nasal congestion, etc).    - Allergy injections improve symptoms in 75-85% of patients.   - We can discuss this more at the next appointment if the medications are not working for you. CPT codes given to call insurance to find out cost of allergy injections. [facility] vs. traditional   Food allergy Continue to avoid shellfish. We will get lab work to follow up on this allergy. We will call you with results once they are back For mild symptoms you can take over the counter antihistamines such as Benadryl and monitor symptoms closely. If symptoms worsen or if  you have severe symptoms including breathing issues, throat closure, significant swelling, whole body hives, severe diarrhea and vomiting, lightheadedness then seek immediate medical care. Emergency Action Plan given and reviewed EpiPen prescription sent and reviewed how to use.  Make sure to carry your EpiPen with you at all times   we have discussed the following in regards to foods:   Allergy: food allergy is when you have eaten a food, developed an allergic reaction after eating the food and have IgE to the food (positive food testing either by skin testing or blood testing).  Food allergy could lead to life threatening symptoms  Sensitivity: occurs when you have IgE to a food (positive food testing either by skin testing or blood testing) but is a food you eat without any issues.  This is not an allergy and we recommend keeping the food in the diet  Intolerance: this is when you have negative testing by either skin testing or blood testing thus not allergic but the food causes symptoms (like belly pain, bloating, diarrhea etc) with ingestion.  These foods should be avoided to prevent symptoms.    Oral allergy syndrome The oral allergy syndrome (OAS) or pollen-food allergy syndrome (PFAS) is a relatively common form of food allergy, particularly in adults. It typically occurs in people who have pollen allergies when the immune system "sees" proteins on the food that look like proteins on the pollen. This results in the allergy antibody (IgE) binding to the food instead of the pollen. Patients typically report itching and/or mild swelling of the mouth and throat immediately following ingestion of certain uncooked fruits (including nuts) or raw vegetables. Only a very small number of affected  individuals experience systemic allergic reactions, such as anaphylaxis which occurs with true food allergies.       Follow up in 3-4 months or sooner if needed.  Control of Dog or Cat Allergen Avoidance is  the best way to manage a dog or cat allergy. If you have a dog or cat and are allergic to dog or cats, consider removing the dog or cat from the home. If you have a dog or cat but don't want to find it a new home, or if your family wants a pet even though someone in the household is allergic, here are some strategies that may help keep symptoms at bay:  Keep the pet out of your bedroom and restrict it to only a few rooms. Be advised that keeping the dog or cat in only one room will not limit the allergens to that room. Don't pet, hug or kiss the dog or cat; if you do, wash your hands with soap and water. High-efficiency particulate air (HEPA) cleaners run continuously in a bedroom or living room can reduce allergen levels over time. Regular use of a high-efficiency vacuum cleaner or a central vacuum can reduce allergen levels. Giving your dog or cat a bath at least once a week can reduce airborne allergen.  Control of Dust Mite Allergen Dust mites play a major role in allergic asthma and rhinitis. They occur in environments with high humidity wherever human skin is found. Dust mites absorb humidity from the atmosphere (ie, they do not drink) and feed on organic matter (including shed human and animal skin). Dust mites are a microscopic type of insect that you cannot see with the naked eye. High levels of dust mites have been detected from mattresses, pillows, carpets, upholstered furniture, bed covers, clothes, soft toys and any woven material. The principal allergen of the dust mite is found in its feces. A gram of dust may contain 1,000 mites and 250,000 fecal particles. Mite antigen is easily measured in the air during house cleaning activities. Dust mites do not bite and do not cause harm to humans, other than by triggering allergies/asthma.  Ways to decrease your exposure to dust mites in your home:  1. Encase mattresses, box springs and pillows with a mite-impermeable barrier or cover  2. Wash  sheets, blankets and drapes weekly in hot water (130 F) with detergent and dry them in a dryer on the hot setting.  3. Have the room cleaned frequently with a vacuum cleaner and a damp dust-mop. For carpeting or rugs, vacuuming with a vacuum cleaner equipped with a high-efficiency particulate air (HEPA) filter. The dust mite allergic individual should not be in a room which is being cleaned and should wait 1 hour after cleaning before going into the room.  4. Do not sleep on upholstered furniture (eg, couches).  5. If possible removing carpeting, upholstered furniture and drapery from the home is ideal. Horizontal blinds should be eliminated in the rooms where the person spends the most time (bedroom, study, television room). Washable vinyl, roller-type shades are optimal.  6. Remove all non-washable stuffed toys from the bedroom. Wash stuffed toys weekly like sheets and blankets above.  7. Reduce indoor humidity to less than 50%. Inexpensive humidity monitors can be purchased at most hardware stores. Do not use a humidifier as can make the problem worse and are not recommended.  Control of Mold Allergen Mold and fungi can grow on a variety of surfaces provided certain temperature and moisture conditions exist.  Outdoor molds grow on plants, decaying vegetation and soil.  The major outdoor mold, Alternaria and Cladosporium, are found in very high numbers during hot and dry conditions.  Generally, a late Summer - Fall peak is seen for common outdoor fungal spores.  Rain will temporarily lower outdoor mold spore count, but counts rise rapidly when the rainy period ends.  The most important indoor molds are Aspergillus and Penicillium.  Dark, humid and poorly ventilated basements are ideal sites for mold growth.  The next most common sites of mold growth are the bathroom and the kitchen.  Outdoor [employer] Use air conditioning and keep windows closed Avoid exposure to decaying vegetation. Avoid  leaf raking. Avoid grain handling. Consider wearing a face mask if working in moldy areas.  Indoor Mold Control Maintain humidity below 50%. Clean washable surfaces with 5% bleach solution. Remove sources e.g. Contaminated carpets.  Reducing Pollen Exposure The American Academy of Allergy, Asthma and Immunology suggests the following steps to reduce your exposure to pollen during allergy seasons. Do not hang sheets or clothing out to dry; pollen may collect on these items. Do not mow lawns or spend time around freshly cut grass; mowing stirs up pollen. Keep windows closed at night.  Keep car windows closed while driving. Minimize morning activities outdoors, a time when pollen counts are usually at their highest. Stay indoors as much as possible when pollen counts or humidity is high and on windy days when pollen tends to remain in the air longer. Use air conditioning when possible.  Many air conditioners have filters that trap the pollen spores. Use a HEPA room air filter to remove pollen form the indoor air you breathe.

## 2023-01-18 ENCOUNTER — Ambulatory Visit (INDEPENDENT_AMBULATORY_CARE_PROVIDER_SITE_OTHER): Payer: No Typology Code available for payment source | Admitting: Family

## 2023-01-18 ENCOUNTER — Encounter: Payer: Self-pay | Admitting: Family

## 2023-01-18 ENCOUNTER — Other Ambulatory Visit: Payer: Self-pay

## 2023-01-18 VITALS — BP 120/62 | HR 56 | Temp 98.3°F | Resp 12

## 2023-01-18 DIAGNOSIS — J3089 Other allergic rhinitis: Secondary | ICD-10-CM

## 2023-01-18 DIAGNOSIS — J302 Other seasonal allergic rhinitis: Secondary | ICD-10-CM | POA: Diagnosis not present

## 2023-01-18 DIAGNOSIS — T7800XD Anaphylactic reaction due to unspecified food, subsequent encounter: Secondary | ICD-10-CM

## 2023-01-18 DIAGNOSIS — J4540 Moderate persistent asthma, uncomplicated: Secondary | ICD-10-CM

## 2023-01-18 DIAGNOSIS — T7800XA Anaphylactic reaction due to unspecified food, initial encounter: Secondary | ICD-10-CM | POA: Diagnosis not present

## 2023-01-18 DIAGNOSIS — Z880 Allergy status to penicillin: Secondary | ICD-10-CM

## 2023-01-18 DIAGNOSIS — T781XXD Other adverse food reactions, not elsewhere classified, subsequent encounter: Secondary | ICD-10-CM

## 2023-01-18 MED ORDER — EPINEPHRINE 0.3 MG/0.3ML IJ SOAJ: 0.3000 mg | INTRAMUSCULAR | 1 refills | Status: DC | PRN

## 2023-01-18 NOTE — Progress Notes (Signed)
Date of Service/Encounter:  01/18/23  Allergy testing appointment   Initial visit on October 19, 2022, seen for not well-controlled moderate persistent asthma, anaphylactic reaction due to food, penicillin allergy, and allergic rhinitis.  Please see that note for additional details.  She reports that her breathing has been better since being on Symbicort and Zyrtec.  She denies cough, wheeze, tightness in chest, shortness of breath, and nocturnal awakenings due to breathing problems.  Since her last office visit she has not required any systemic steroids or made any trips to the emergency room or urgent care due to breathing problems.  The last time she used her albuterol was once last week and prior to that she has not needed to use it.  She does mention that since being off antihistamine she feels like she has a cold, rundown, and sinus symptoms.  She just does not like to take antihistamines daily.  She reports a reaction to shrimp, scallops, and lobster.  She reports that she ate these foods 3 times in 1 day.  She ended up with an IV of epinephrine.  She reports that she does have an EpiPen, but does not carry it on her.  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  She used 2 drops of iodine to wash vegetables once when she was in [place] and had a reaction.  She reports that she had anaphylaxis with iodine.  She is also requesting additional testing to wheat, cows milk, and casein.  She reports that she has intolerance with these foods.  Also apple and Kenalog cause pruritus of the mouth  Today reports for allergy diagnostic testing:   Skin prick testing today was positive to grass pollen, ragweed mix tree pollen, 1 mold, dust mite mix, dog epithelia and horse with adequate controls.  Scalp was positive (3 x 4) with good histamine response.  Wheat, cows milk, casein chicken egg white, shrimp, crab, lobster, oyster, apple, and cantaloupe were negative.  Intradermal testing was: Positive to  [place] grass, major mold mix 3, and cat hair   DIAGNOSTICS:  Skin Testing: Environmental allergy panel and select foods. Adequate positive and negative controls Results discussed with patient/family.   Airborne Adult Perc - 01/18/23 0923     Time Antigen Placed 4403    Allergen Manufacturer Waynette Buttery    Location Back    Number of Test 55    1. Control-Buffer 50% Glycerol Negative    2. Control-Histamine 3+    3. Bahia 2+    4. [place] Negative    5. Johnson 2+    6. Kentucky Blue Negative    7. Meadow Fescue 2+    8. Perennial Rye 3+    9. Timothy 2+    10. Ragweed Mix 2+    11. Cocklebur Negative    12. Plantain,  English Negative    13. Baccharis Negative    14. Dog Fennel Negative    15. Russian Thistle Negative    16. Lamb's Quarters Negative    17. Sheep Sorrell Negative    18. Rough Pigweed Negative    19. Marsh Elder, Rough Negative    20. Mugwort, Common Negative    21. Box, Elder Negative    22. Cedar, red Negative    23. Sweet Gum Negative    24. Pecan Pollen Negative    25. Pine Mix Negative    26. Walnut, Black Pollen --   +/-   27. Red Mulberry Negative  28. Ash Mix Negative    29. Birch Mix Negative    30. Beech American Negative    31. Cottonwood, [place] Negative    32. Hickory, White Negative    33. Maple Mix Negative    34. Oak, [place] Mix 2+    35. Sycamore Eastern Negative    36. Alternaria Alternata Negative    37. Cladosporium Herbarum 2+    38. Aspergillus Mix Negative    39. Penicillium Mix Negative    40. Bipolaris Sorokiniana (Helminthosporium) Negative    41. Drechslera Spicifera (Curvularia) Negative    42. Mucor Plumbeus Negative    43. Fusarium Moniliforme Negative    44. Aureobasidium Pullulans (pullulara) Negative    45. Rhizopus Oryzae Negative    46. Botrytis Cinera Negative    47. Epicoccum Nigrum Negative    48. Phoma Betae Negative    49. Dust Mite Mix 2+    50. Cat Hair 10,000 BAU/ml Negative    51.  Dog Epithelia  2+    52. Mixed Feathers Negative    53. Horse Epithelia 2+    54. Cockroach, German Negative    55. Tobacco Leaf Negative             Intradermal - 01/18/23 1055     Time Antigen Placed 1025    Allergen Manufacturer Greer    Location Arm    Number of Test 8    Control Negative    [place] 3+    Weed Mix Negative    Mold 2 Negative    Mold 3 3+    Mold 4 Negative    Cat 3+    Cockroach Negative             Food Adult Perc - 01/18/23 0900     Time Antigen Placed 1610    Allergen Manufacturer Waynette Buttery    Location Back    Number of allergen test 12    3. Wheat Negative    5. Milk, Cow Negative    6. Casein Negative    7. Egg White, Chicken Negative    8. Shellfish Mix Negative    23. Shrimp Negative    24. Crab Negative    25. Lobster Negative    26. Oyster Negative    27. Scallops --   3x4   58. Apple Negative    63. Cantaloupe Negative              Allergy testing results were read and interpreted by myself, documented by clinical staff.  Patient provided with copy of allergy testing along with avoidance measures when indicated.   Moderate persistent asthma: controlled Daily controller medication(s): continue Symbicort 2 puffs twice a day with spacer and rinse mouth afterwards. May use albuterol rescue inhaler 2-4 puffs every 4 to 6 hours as needed for shortness of breath, chest tightness, coughing, and wheezing. Monitor frequency of use.  Breathing control goals:  Full participation in all desired activities (may need albuterol before activity) Albuterol use two times or less a week on average (not counting use with activity) Cough interfering with sleep two times or less a month Oral steroids no more than once a year No hospitalizations   Allergic rhinitis- not well controlled Skin testing today is positive to grass pollen, ragweed mix, tree pollen, mold, dust mite, cat, dog, and horse with adequate controls Copy of skin test given. Start  avoidance measures as below Use over the counter antihistamines such as Zyrtec (  cetirizine), Claritin (loratadine), Allegra (fexofenadine), or Xyzal (levocetirizine) daily as needed. May take twice a day during allergy flares. May switch antihistamines every few months.  Consider allergy injections as a means of long-term control. - Allergy injections "re-train" and "reset" the immune system to ignore environmental allergens and decrease the resulting immune response to those allergens (sneezing, itchy watery eyes, runny nose, nasal congestion, etc).    - Allergy injections improve symptoms in 75-85% of patients.   - We can discuss this more at the next appointment if the medications are not working for you. CPT codes given to call insurance to find out cost of allergy injections. [facility] vs. traditional   Food allergy Continue to avoid shellfish. We will get lab work to follow up on this allergy. We will call you with results once they are back For mild symptoms you can take over the counter antihistamines such as Benadryl and monitor symptoms closely. If symptoms worsen or if you have severe symptoms including breathing issues, throat closure, significant swelling, whole body hives, severe diarrhea and vomiting, lightheadedness then seek immediate medical care. Emergency Action Plan given and reviewed EpiPen prescription sent and reviewed how to use.  Make sure to carry your EpiPen with you at all times   we have discussed the following in regards to foods:   Allergy: food allergy is when you have eaten a food, developed an allergic reaction after eating the food and have IgE to the food (positive food testing either by skin testing or blood testing).  Food allergy could lead to life threatening symptoms  Sensitivity: occurs when you have IgE to a food (positive food testing either by skin testing or blood testing) but is a food you eat without any issues.  This is not an allergy and we recommend  keeping the food in the diet  Intolerance: this is when you have negative testing by either skin testing or blood testing thus not allergic but the food causes symptoms (like belly pain, bloating, diarrhea etc) with ingestion.  These foods should be avoided to prevent symptoms.    Oral allergy syndrome The oral allergy syndrome (OAS) or pollen-food allergy syndrome (PFAS) is a relatively common form of food allergy, particularly in adults. It typically occurs in people who have pollen allergies when the immune system "sees" proteins on the food that look like proteins on the pollen. This results in the allergy antibody (IgE) binding to the food instead of the pollen. Patients typically report itching and/or mild swelling of the mouth and throat immediately following ingestion of certain uncooked fruits (including nuts) or raw vegetables. Only a very small number of affected individuals experience systemic allergic reactions, such as anaphylaxis which occurs with true food allergies.       Follow up in 3-4 months or sooner if needed.  Control of Dog or Cat Allergen Avoidance is the best way to manage a dog or cat allergy. If you have a dog or cat and are allergic to dog or cats, consider removing the dog or cat from the home. If you have a dog or cat but don't want to find it a new home, or if your family wants a pet even though someone in the household is allergic, here are some strategies that may help keep symptoms at bay:  Keep the pet out of your bedroom and restrict it to only a few rooms. Be advised that keeping the dog or cat in only one room will not limit the  allergens to that room. Don't pet, hug or kiss the dog or cat; if you do, wash your hands with soap and water. High-efficiency particulate air (HEPA) cleaners run continuously in a bedroom or living room can reduce allergen levels over time. Regular use of a high-efficiency vacuum cleaner or a central vacuum can reduce allergen  levels. Giving your dog or cat a bath at least once a week can reduce airborne allergen.  Control of Dust Mite Allergen Dust mites play a major role in allergic asthma and rhinitis. They occur in environments with high humidity wherever human skin is found. Dust mites absorb humidity from the atmosphere (ie, they do not drink) and feed on organic matter (including shed human and animal skin). Dust mites are a microscopic type of insect that you cannot see with the naked eye. High levels of dust mites have been detected from mattresses, pillows, carpets, upholstered furniture, bed covers, clothes, soft toys and any woven material. The principal allergen of the dust mite is found in its feces. A gram of dust may contain 1,000 mites and 250,000 fecal particles. Mite antigen is easily measured in the air during house cleaning activities. Dust mites do not bite and do not cause harm to humans, other than by triggering allergies/asthma.  Ways to decrease your exposure to dust mites in your home:  1. Encase mattresses, box springs and pillows with a mite-impermeable barrier or cover  2. Wash sheets, blankets and drapes weekly in hot water (130 F) with detergent and dry them in a dryer on the hot setting.  3. Have the room cleaned frequently with a vacuum cleaner and a damp dust-mop. For carpeting or rugs, vacuuming with a vacuum cleaner equipped with a high-efficiency particulate air (HEPA) filter. The dust mite allergic individual should not be in a room which is being cleaned and should wait 1 hour after cleaning before going into the room.  4. Do not sleep on upholstered furniture (eg, couches).  5. If possible removing carpeting, upholstered furniture and drapery from the home is ideal. Horizontal blinds should be eliminated in the rooms where the person spends the most time (bedroom, study, television room). Washable vinyl, roller-type shades are optimal.  6. Remove all non-washable stuffed toys  from the bedroom. Wash stuffed toys weekly like sheets and blankets above.  7. Reduce indoor humidity to less than 50%. Inexpensive humidity monitors can be purchased at most hardware stores. Do not use a humidifier as can make the problem worse and are not recommended.  Control of Mold Allergen Mold and fungi can grow on a variety of surfaces provided certain temperature and moisture conditions exist.  Outdoor molds grow on plants, decaying vegetation and soil.  The major outdoor mold, Alternaria and Cladosporium, are found in very high numbers during hot and dry conditions.  Generally, a late Summer - Fall peak is seen for common outdoor fungal spores.  Rain will temporarily lower outdoor mold spore count, but counts rise rapidly when the rainy period ends.  The most important indoor molds are Aspergillus and Penicillium.  Dark, humid and poorly ventilated basements are ideal sites for mold growth.  The next most common sites of mold growth are the bathroom and the kitchen.  Outdoor [employer] Use air conditioning and keep windows closed Avoid exposure to decaying vegetation. Avoid leaf raking. Avoid grain handling. Consider wearing a face mask if working in moldy areas.  Indoor Mold Control Maintain humidity below 50%. Clean washable surfaces with 5% bleach  solution. Remove sources e.g. Contaminated carpets.  Reducing Pollen Exposure The American Academy of Allergy, Asthma and Immunology suggests the following steps to reduce your exposure to pollen during allergy seasons. Do not hang sheets or clothing out to dry; pollen may collect on these items. Do not mow lawns or spend time around freshly cut grass; mowing stirs up pollen. Keep windows closed at night.  Keep car windows closed while driving. Minimize morning activities outdoors, a time when pollen counts are usually at their highest. Stay indoors as much as possible when pollen counts or humidity is high and on windy days when  pollen tends to remain in the air longer. Use air conditioning when possible.  Many air conditioners have filters that trap the pollen spores. Use a HEPA room air filter to remove pollen form the indoor air you breathe.   Nehemiah Settle, FNP [facility]

## 2023-01-26 NOTE — Progress Notes (Signed)
Please let Elaine Dorsey that her lab work to shellfish came back low to shrimp.  Clam, crab, scallop, oyster, and lobster were negative.  Her skin testing to shrimp was negative on January 18, 2023.  Scallops was 3 x 4.  Discussed results with Dr. Selena Batten.  If Elaine Dorsey is interested in doing an in office oral food challenge to shrimp, this would need to be done with Dr. Selena Batten only, in our [place] office at 8:30 in the morning.  We would do a skin test to fresh shrimp first due to her previous reaction.  She would need to be off all antihistamines 3 days prior to this appointment and in good health (not on any antibiotics or received recent vaccines).  This appointment will last approximately 2 to 3 hours.  If she is not interested in doing an in office oral food challenge to shellfish she needs to continue to avoid shellfish and have access to her epinephrine autoinjector device at all times.

## 2023-03-09 ENCOUNTER — Telehealth: Payer: Self-pay | Admitting: Allergy

## 2023-03-09 NOTE — Telephone Encounter (Signed)
I called patient to reminder her of her shrimp challenge and she would like to know what it is going to cost, because she said she may do it herself. [phone]

## 2023-03-16 ENCOUNTER — Encounter: Payer: No Typology Code available for payment source | Admitting: Allergy

## 2024-06-05 ENCOUNTER — Telehealth: Payer: Self-pay

## 2024-06-05 DIAGNOSIS — J4540 Moderate persistent asthma, uncomplicated: Secondary | ICD-10-CM

## 2024-06-05 MED ORDER — ALBUTEROL SULFATE HFA 108 (90 BASE) MCG/ACT IN AERS: 2.0000 | INHALATION_SPRAY | Freq: Four times a day (QID) | RESPIRATORY_TRACT | 0 refills | Status: DC | PRN

## 2024-06-05 MED ORDER — BUDESONIDE-FORMOTEROL FUMARATE 80-4.5 MCG/ACT IN AERO: 2.0000 | INHALATION_SPRAY | Freq: Two times a day (BID) | RESPIRATORY_TRACT | 0 refills | Status: AC

## 2024-06-05 NOTE — Telephone Encounter (Signed)
 Patient called in stating she needs refill on inhalers. I seen patient needs appointment. Scheduled patient for 06/19/2024 @ 11AM. Patient wasn't aware she needed to come in yearly.

## 2024-06-07 ENCOUNTER — Other Ambulatory Visit: Payer: Self-pay

## 2024-06-07 ENCOUNTER — Ambulatory Visit: Payer: PRIVATE HEALTH INSURANCE | Admitting: Family Medicine

## 2024-06-07 ENCOUNTER — Encounter: Payer: Self-pay | Admitting: Family Medicine

## 2024-06-07 VITALS — BP 102/64 | HR 72 | Temp 98.2°F | Ht 65.0 in | Wt 156.3 lb

## 2024-06-07 DIAGNOSIS — T7800XD Anaphylactic reaction due to unspecified food, subsequent encounter: Secondary | ICD-10-CM

## 2024-06-07 DIAGNOSIS — J4540 Moderate persistent asthma, uncomplicated: Secondary | ICD-10-CM

## 2024-06-07 DIAGNOSIS — Z3A25 25 weeks gestation of pregnancy: Secondary | ICD-10-CM | POA: Insufficient documentation

## 2024-06-07 DIAGNOSIS — J3089 Other allergic rhinitis: Secondary | ICD-10-CM

## 2024-06-07 DIAGNOSIS — J302 Other seasonal allergic rhinitis: Secondary | ICD-10-CM

## 2024-06-07 MED ORDER — BUDESONIDE-FORMOTEROL FUMARATE 160-4.5 MCG/ACT IN AERO: 2.0000 | INHALATION_SPRAY | Freq: Two times a day (BID) | RESPIRATORY_TRACT | 5 refills | Status: DC

## 2024-06-07 MED ORDER — AIRSUPRA 90-80 MCG/ACT IN AERO: 2.0000 | INHALATION_SPRAY | RESPIRATORY_TRACT | 3 refills | Status: DC | PRN

## 2024-06-07 NOTE — Patient Instructions (Addendum)
 Asthma Begin Symbicort  160-2 puffs twice a day with a spacer to prevent cough or wheeze.  This will replace Symbicort  80 Begin Airsupra 2 puffs as needed for shortness of breath, cough, or wheeze.  Do not use this medication more than 12 puffs in a 24-hour time span  Allergic rhinitis Continue allergen avoidance measures directed towards grass pollen, ragweed pollen, tree pollen, cat, dog, mold, and dust mites as listed below Continue cetirizine 10 mg once a day if needed for runny nose or itch.  Consider Rhinocort  nasal spray if needed for stuffy nose Consider saline nasal rinses as needed for nasal symptoms. Use this before any medicated nasal sprays for best result  Food allergy  Continue to avoid shellfish.  In case of an allergic reaction, take cetirizine 10 mg once every 12-24 hours, and if life-threatening symptoms occur, inject with EpiPen  0.3 mg.   Call the clinic if this treatment plan is not working well for you  Follow up in 1 month or sooner if needed.   Reducing Pollen Exposure The American Academy of Allergy , Asthma and Immunology suggests the following steps to reduce your exposure to pollen during allergy  seasons. Do not hang sheets or clothing out to dry; pollen may collect on these items. Do not mow lawns or spend time around freshly cut grass; mowing stirs up pollen. Keep windows closed at night.  Keep car windows closed while driving. Minimize morning activities outdoors, a time when pollen counts are usually at their highest. Stay indoors as much as possible when pollen counts or humidity is high and on windy days when pollen tends to remain in the air longer. Use air conditioning when possible.  Many air conditioners have filters that trap the pollen spores. Use a HEPA room air filter to remove pollen form the indoor air you breathe.    Control of Mold Allergen Mold and fungi can grow on a variety of surfaces provided certain temperature and moisture conditions  exist.  Outdoor molds grow on plants, decaying vegetation and soil.  The major outdoor mold, Alternaria and Cladosporium, are found in very high numbers during hot and dry conditions.  Generally, a late Summer - Fall peak is seen for common outdoor fungal spores.  Rain will temporarily lower outdoor mold spore count, but counts rise rapidly when the rainy period ends.  The most important indoor molds are Aspergillus and Penicillium.  Dark, humid and poorly ventilated basements are ideal sites for mold growth.  The next most common sites of mold growth are the bathroom and the kitchen.  Outdoor [employer] Use air conditioning and keep windows closed Avoid exposure to decaying vegetation. Avoid leaf raking. Avoid grain handling. Consider wearing a face mask if working in moldy areas.  Indoor Mold Control Maintain humidity below 50%. Clean washable surfaces with 5% bleach solution. Remove sources e.g. Contaminated carpets.  Control of Dust Mite Allergen Dust mites play a major role in allergic asthma and rhinitis. They occur in environments with high humidity wherever human skin is found. Dust mites absorb humidity from the atmosphere (ie, they do not drink) and feed on organic matter (including shed human and animal skin). Dust mites are a microscopic type of insect that you cannot see with the naked eye. High levels of dust mites have been detected from mattresses, pillows, carpets, upholstered furniture, bed covers, clothes, soft toys and any woven material. The principal allergen of the dust mite is found in its feces. A gram of dust may contain 1,000  mites and 250,000 fecal particles. Mite antigen is easily measured in the air during house cleaning activities. Dust mites do not bite and do not cause harm to humans, other than by triggering allergies/asthma.  Ways to decrease your exposure to dust mites in your home:  1. Encase mattresses, box springs and pillows with a mite-impermeable barrier  or cover 2. Wash sheets, blankets and drapes weekly in hot water (130 F) with detergent and dry them in a dryer on the hot setting. 3. Have the room cleaned frequently with a vacuum cleaner and a damp dust-mop. For carpeting or rugs, vacuuming with a vacuum cleaner equipped with a high-efficiency particulate air (HEPA) filter. The dust mite allergic individual should not be in a room which is being cleaned and should wait 1 hour after cleaning before going into the room. 4. Do not sleep on upholstered furniture (eg, couches). 5. If possible removing carpeting, upholstered furniture and drapery from the home is ideal. Horizontal blinds should be eliminated in the rooms where the person spends the most time (bedroom, study, television room). Washable vinyl, roller-type shades are optimal. 6. Remove all non-washable stuffed toys from the bedroom. Wash stuffed toys weekly like sheets and blankets above. 7. Reduce indoor humidity to less than 50%. Inexpensive humidity monitors can be purchased at most hardware stores. Do not use a humidifier as can make the problem worse and are not recommended.  Control of Dog or Cat Allergen Avoidance is the best way to manage a dog or cat allergy . If you have a dog or cat and are allergic to dog or cats, consider removing the dog or cat from the home. If you have a dog or cat but dont want to find it a new home, or if your family wants a pet even though someone in the household is allergic, here are some strategies that may help keep symptoms at bay:  Keep the pet out of your bedroom and restrict it to only a few rooms. Be advised that keeping the dog or cat in only one room will not limit the allergens to that room. Dont pet, hug or kiss the dog or cat; if you do, wash your hands with soap and water. High-efficiency particulate air (HEPA) cleaners run continuously in a bedroom or living room can reduce allergen levels over time. Regular use of a high-efficiency  vacuum cleaner or a central vacuum can reduce allergen levels. Giving your dog or cat a bath at least once a week can reduce airborne allergen.

## 2024-06-07 NOTE — Progress Notes (Signed)
 [address] [place] [address] Dept: [phone]  FOLLOW UP NOTE  Patient ID: Elaine Dorsey, female    DOB: [birth year]  Age: 43 y.o. MRN: [id number] Date of Office Visit: 06/07/2024  Assessment  Chief Complaint: Asthma, Seasonal and perennial allergic rhinitis, Follow-up, Cough, and Shortness of Breath  HPI Elaine Dorsey is a 43 year old female whoi presents to the clinic for an evaluation of poorly controlled asthma during pregnancy. She was last seen in this clinic on 01/18/2023 by Wanda Craze, FNP, for evaluation of asthma, allergic rhinitis, and food allergy  to shellfish.  Discussed the use of AI scribe software for clinical note transcription with the patient, who gave verbal consent to proceed.  History of Present Illness Elaine Dorsey is a 43 year old female with asthma who presents with worsening shortness of breath and cough.  She has a history of asthma with fluctuating control. Over the past two weeks, she has experienced worsening symptoms, including shortness of breath and a persistent dry cough. She describes episodes where she loses her breath mid-sentence and has to stop to catch her breath. She has been using her albuterol  inhaler three to five times a day without significant relief. She is a very active person but has recently been unable to exercise due to her symptoms, feeling out of breath with minimal exertion. No wheezing but finds it hard to catch her breath.  She recently resumed using Symbicort  80-2 puffs twice a day after a several month lapse in refilling her prescription. She picked up a new supply two days ago but has not noticed any improvement in her symptoms yet. She does not currently use a spacer with her inhalers and has difficulty holding her breath after inhalation, sometimes resulting in coughing out the medication.  She also reports allergy  symptoms, including waking up coughing and needing to clear her throat  frequently. She takes Zyrtec daily and is not using Flonase at this time. Her last environmental allergy  skin testing on 01/18/2023 was positiv eto grass pollen and ragweed pollen.   She continues to avoid shellfish with no accidental ingestion or EpiPen  use since her last visit to this clinic.   Her current medications are listed in the chart.   Drug Allergies:  Allergies[1]  Physical Exam: BP 102/64 (BP Location: Right Arm, Patient Position: Sitting, Cuff Size: Normal)   Pulse 72   Temp 98.2 F (36.8 C) (Temporal)   Ht 5' 5 (1.651 m)   Wt 156 lb 4.8 oz (70.9 kg)   SpO2 96%   BMI 26.01 kg/m    Physical Exam Vitals reviewed.  Constitutional:      Appearance: Normal appearance. She is well-developed.  HENT:     Head: Normocephalic and atraumatic.     Right Ear: Tympanic membrane normal.     Left Ear: Tympanic membrane normal.     Nose: Nose normal.     Mouth/Throat:     Pharynx: Oropharynx is clear.  Eyes:     Conjunctiva/sclera: Conjunctivae normal.  Cardiovascular:     Rate and Rhythm: Normal rate and regular rhythm.     Heart sounds: Normal heart sounds. No murmur heard. Pulmonary:     Effort: Pulmonary effort is normal.     Breath sounds: Normal breath sounds.     Comments: Lungs clear to auscultation Musculoskeletal:        General: Normal range of motion.     Cervical back: Normal range of motion and neck  supple.  Skin:    General: Skin is warm and dry.  Neurological:     Mental Status: She is alert and oriented to person, place, and time.  Psychiatric:        Mood and Affect: Mood normal.        Behavior: Behavior normal.        Thought Content: Thought content normal.        Judgment: Judgment normal.     Diagnostics: FVC 3.93 which is 104% of predicted value. FEV1 2.99 which is 98% of predicted value. Spirometry indicates normal ventilatory function.  Assessment and Plan: 1. Not well controlled moderate persistent asthma   2. Anaphylactic reaction  due to food, subsequent encounter   3. Seasonal and perennial allergic rhinitis   4. [redacted] weeks gestation of pregnancy     Meds ordered this encounter  Medications   Albuterol -Budesonide  (AIRSUPRA ) 90-80 MCG/ACT AERO    Sig: Inhale 2 puffs into the lungs as needed.    Dispense:  10.7 g    Refill:  3   budesonide -formoterol  (SYMBICORT ) 160-4.5 MCG/ACT inhaler    Sig: Inhale 2 puffs into the lungs 2 (two) times daily.    Dispense:  1 each    Refill:  5    Patient Instructions  Asthma Begin Symbicort  160-2 puffs twice a day with a spacer to prevent cough or wheeze.  This will replace Symbicort  80 Begin Airsupra  2 puffs as needed for shortness of breath, cough, or wheeze.  Do not use this medication more than 12 puffs in a 24-hour time span  Allergic rhinitis Continue allergen avoidance measures directed towards grass pollen, ragweed pollen, tree pollen, cat, dog, mold, and dust mites as listed below Continue cetirizine 10 mg once a day if needed for runny nose or itch.  Consider Rhinocort  nasal spray if needed for stuffy nose Consider saline nasal rinses as needed for nasal symptoms. Use this before any medicated nasal sprays for best result  Food allergy  Continue to avoid shellfish.  In case of an allergic reaction, take cetirizine 10 mg once every 12-24 hours, and if life-threatening symptoms occur, inject with EpiPen  0.3 mg.   Call the clinic if this treatment plan is not working well for you  Follow up in 1 month or sooner if needed.  Return in about 4 weeks (around 07/05/2024), or if symptoms worsen or fail to improve.    Thank you for the opportunity to care for this patient.  Please do not hesitate to contact me with questions.  Arlean Mutter, FNP [facility]        Was positive to grass pollen and ragweed pollen.  /    [1]  Allergies Allergen Reactions   Iodine Anaphylaxis   Shellfish Allergy  Anaphylaxis   Penicillins

## 2024-06-12 ENCOUNTER — Telehealth: Payer: Self-pay | Admitting: Family Medicine

## 2024-06-12 ENCOUNTER — Ambulatory Visit: Admitting: Allergy

## 2024-06-12 ENCOUNTER — Other Ambulatory Visit: Payer: Self-pay | Admitting: Family Medicine

## 2024-06-12 MED ORDER — BUDESONIDE-FORMOTEROL FUMARATE 160-4.5 MCG/ACT IN AERO: 2.0000 | INHALATION_SPRAY | Freq: Two times a day (BID) | RESPIRATORY_TRACT | 5 refills | Status: DC

## 2024-06-12 MED ORDER — AIRSUPRA 90-80 MCG/ACT IN AERO: 2.0000 | INHALATION_SPRAY | RESPIRATORY_TRACT | 3 refills | Status: DC | PRN

## 2024-06-12 NOTE — Telephone Encounter (Signed)
 Received fax from [facility] requesting more specific instructions on airsupra . Reviewed note by Arlean Mutter, FNP and faxed back to [facility] on [address].

## 2024-06-12 NOTE — Telephone Encounter (Signed)
 Pt called stating that her medication was sent to the wrong pharmacy. She wants it sent to the [facility] on [address]. And also the medications are too expensive for her she would like a generic brand if possible. And the coupons that was given to her in office does not work

## 2024-06-12 NOTE — Telephone Encounter (Signed)
 Resent to [facility]. Please have her call if Symbicort  160 is not covered and we will try a different one

## 2024-06-18 ENCOUNTER — Other Ambulatory Visit: Payer: Self-pay | Admitting: *Deleted

## 2024-06-18 DIAGNOSIS — J4540 Moderate persistent asthma, uncomplicated: Secondary | ICD-10-CM

## 2024-06-18 MED ORDER — AIRSUPRA 90-80 MCG/ACT IN AERO: 2.0000 | INHALATION_SPRAY | RESPIRATORY_TRACT | 3 refills | Status: DC | PRN

## 2024-06-18 NOTE — Telephone Encounter (Signed)
 [facility] needed more specific instructions than just as needed. Resent.

## 2024-06-19 ENCOUNTER — Ambulatory Visit: Admitting: Allergy

## 2024-07-02 ENCOUNTER — Telehealth: Payer: Self-pay | Admitting: Family Medicine

## 2024-07-02 ENCOUNTER — Other Ambulatory Visit: Payer: Self-pay | Admitting: Allergy

## 2024-07-02 DIAGNOSIS — J4540 Moderate persistent asthma, uncomplicated: Secondary | ICD-10-CM

## 2024-07-02 NOTE — Telephone Encounter (Signed)
 Elaine Dorsey called and stated that she still needs a prescription to replace the Air Supra inhaler that was too expensive for her to fill. She asks if she can get something generic instead. She also states the other inhaler she had prescribed had to be special ordered so she is still without that, and is having a lot of trouble breathing. She asked to speak to someone in clinical if possible to get this all figured out, and possible wants to reschedule her visit on the 19th, as she has not been able to start any of the meds yet so she does not know how they will work. Requesting a call back ASAP. Leave detailed message if she cannot answer.

## 2024-07-02 NOTE — Telephone Encounter (Signed)
 Called and left a voicemail asking for a return call to discuss.  ?

## 2024-07-03 ENCOUNTER — Other Ambulatory Visit: Payer: Self-pay

## 2024-07-03 MED ORDER — BUDESONIDE-FORMOTEROL FUMARATE 160-4.5 MCG/ACT IN AERO: 2.0000 | INHALATION_SPRAY | Freq: Two times a day (BID) | RESPIRATORY_TRACT | 5 refills | Status: AC

## 2024-07-03 NOTE — Telephone Encounter (Signed)
 Called and left a voicemail asking for a return call to discuss.  ?

## 2024-07-05 ENCOUNTER — Encounter: Payer: Self-pay | Admitting: *Deleted

## 2024-07-05 NOTE — Telephone Encounter (Signed)
 My Chart message sent

## 2024-07-08 ENCOUNTER — Other Ambulatory Visit: Payer: Self-pay | Admitting: Allergy

## 2024-07-08 DIAGNOSIS — J4540 Moderate persistent asthma, uncomplicated: Secondary | ICD-10-CM

## 2024-07-09 ENCOUNTER — Ambulatory Visit: Payer: PRIVATE HEALTH INSURANCE | Admitting: Family Medicine

## 2024-07-26 ENCOUNTER — Other Ambulatory Visit: Payer: Self-pay

## 2024-07-26 ENCOUNTER — Other Ambulatory Visit: Payer: Self-pay | Admitting: Family Medicine

## 2024-07-26 ENCOUNTER — Ambulatory Visit: Payer: PRIVATE HEALTH INSURANCE | Admitting: Family Medicine

## 2024-07-26 ENCOUNTER — Encounter: Payer: Self-pay | Admitting: Family Medicine

## 2024-07-26 VITALS — BP 110/90 | HR 81 | Temp 98.0°F

## 2024-07-26 DIAGNOSIS — J3089 Other allergic rhinitis: Secondary | ICD-10-CM

## 2024-07-26 DIAGNOSIS — K219 Gastro-esophageal reflux disease without esophagitis: Secondary | ICD-10-CM | POA: Insufficient documentation

## 2024-07-26 DIAGNOSIS — T7800XD Anaphylactic reaction due to unspecified food, subsequent encounter: Secondary | ICD-10-CM

## 2024-07-26 DIAGNOSIS — J302 Other seasonal allergic rhinitis: Secondary | ICD-10-CM

## 2024-07-26 DIAGNOSIS — Z880 Allergy status to penicillin: Secondary | ICD-10-CM

## 2024-07-26 DIAGNOSIS — Z3A32 32 weeks gestation of pregnancy: Secondary | ICD-10-CM | POA: Insufficient documentation

## 2024-07-26 DIAGNOSIS — J4540 Moderate persistent asthma, uncomplicated: Secondary | ICD-10-CM

## 2024-07-26 MED ORDER — LEVOCETIRIZINE DIHYDROCHLORIDE 5 MG PO TABS: 5.0000 mg | ORAL_TABLET | ORAL | 5 refills | Status: AC | PRN

## 2024-07-26 MED ORDER — BUDESONIDE 0.5 MG/2ML IN SUSP: RESPIRATORY_TRACT | 1 refills | Status: DC

## 2024-07-26 MED ORDER — EPINEPHRINE 0.3 MG/0.3ML IJ SOAJ: 0.3000 mg | INTRAMUSCULAR | 1 refills | Status: AC | PRN

## 2024-07-26 NOTE — Progress Notes (Unsigned)
 "  [address] [place] [address] Dept: [phone]  FOLLOW UP NOTE  Patient ID: Elaine Dorsey, female    DOB: Jan 31, 1981  Age: 44 y.o. MRN: [id number] Date of Office Visit: 07/26/2024  Assessment  Chief Complaint: Follow-up, Cough, Wheezing, and Breathing Problem  HPI Elaine Dorsey is a 44 year old female who presents to the clinic for a follow up visit. She was last seen in this clinic on 06/07/2024 by Arlean Mutter, FNP, for evaluation of poorly controlled asthma, allergic rhinitis, food allergy  to shellfish, and pregnancy.   At today's visit, she reports that her asthma has not been well controlled with symptoms inlcusing shortness of breath, wheeze, and dry cough which occurs mostly at night. She continues Symbicort  160-2 puffs twice a day with a spacer and has been using albuterol  frequently with moderate relief of symptoms. She reports some relief while using AirSupra  in addition to Symbicort , however, was not covered by her insurance. She reports that she is having difficulty holding the Symbicort  after inhalation due to cough.  Allergic rhinitis is reported as poorly controlled with symptoms including nasal congestion and post nasal drainage. She continues cetirizine daily, as she has for the last 2 years and uses budesonide  nasal spray as needed with only moderate relief of symptoms. She reports that she took a Benadryl last week with relief of symptoms. Her last environmental allergy  skin testing on 01/18/2023 was positive to grass pollen and ragweed pollen.   Reflux is reported as poorly controlled with heart burn as the main symptom occurring several days a week. She reports that she has a small amount of caffeine in the morning and does not consume mints. She is not currently taking famotidine 20 mg twice a day for reflux control with only minimum relief of symptoms.  She does report that her OB/GYN provider has ordered something different, however, she cannot  remember the name.  She has not started her new reflux medication at this time.  She continues to avoid shellfish with no accidental ingestion or EpiPen  use since her last visit to this clinic.   Her current medications are listed in the chart.   Drug Allergies:  Allergies[1]  Physical Exam: BP (!) 110/90   Pulse 81   Temp 98 F (36.7 C)   SpO2 97%    Physical Exam Vitals reviewed.  Constitutional:      Appearance: Normal appearance.  HENT:     Head: Normocephalic and atraumatic.     Right Ear: Tympanic membrane normal.     Left Ear: Tympanic membrane normal.     Nose:     Comments: Bilateral nares snormal. Pahrynx normal. Ears normal. Eyes normal.    Mouth/Throat:     Pharynx: Oropharynx is clear.  Eyes:     Conjunctiva/sclera: Conjunctivae normal.  Cardiovascular:     Rate and Rhythm: Normal rate and regular rhythm.     Heart sounds: Normal heart sounds. No murmur heard. Pulmonary:     Effort: Pulmonary effort is normal.     Breath sounds: Normal breath sounds.     Comments: Lungs clear to auscultation Musculoskeletal:        General: Normal range of motion.     Cervical back: Normal range of motion and neck supple.  Skin:    General: Skin is warm and dry.  Neurological:     Mental Status: She is alert and oriented to person, place, and time.  Psychiatric:  Mood and Affect: Mood normal.        Behavior: Behavior normal.        Thought Content: Thought content normal.        Judgment: Judgment normal.     Diagnostics: FVC 3.67 which is 97% of predicted value, FEV1 3.02 which is 99% of predicted value.  Spirometry indicates normal ventilatory function.  Assessment and Plan: 1. Seasonal and perennial allergic rhinitis   2. Not well controlled moderate persistent asthma   3. Anaphylactic reaction due to food, subsequent encounter   4. Penicillin allergy    5. Gastroesophageal reflux disease, unspecified whether esophagitis present   6. [redacted] weeks  gestation of pregnancy     Meds ordered this encounter  Medications   EPINEPHrine  0.3 mg/0.3 mL IJ SOAJ injection    Sig: Inject 0.3 mg into the muscle as needed.    Dispense:  2 each    Refill:  1   budesonide  (PULMICORT ) 0.5 MG/2ML nebulizer solution    Sig: For asthma flare, begin budesonide  0.5 mg via nebulizer twice a day for 1-2 weeks or until cough and wheeze free, then stop    Dispense:  75 mL    Refill:  1   levocetirizine (XYZAL ) 5 MG tablet    Sig: Take 1 tablet (5 mg total) by mouth as needed for allergies.    Dispense:  30 tablet    Refill:  5    Patient Instructions  Asthma Continue Symbicort  160-2 puffs twice a day with a spacer to prevent cough or wheeze.   Continue albuterol  2 puffs every 4 hours as needed for cough or wheeze OR Instead use albuterol  0.083% solution via nebulizer one unit vial every 4 hours as needed for cough or wheeze  You may use albuterol  2 puffs 5-15 minutes before activity to decrease cough or wheeze For asthma flare, begin budesonide  0.5 mg via nebulizer twice a day for 1-2 weeks or until cough and wheeze free, then stop  Allergic rhinitis Continue allergen avoidance measures directed towards grass pollen, ragweed pollen, tree pollen, cat, dog, mold, and dust mites as listed below Begin levocetirizine 5 mg once a day if needed for runny nose or itch. This will replace cetirizine for now Consider Rhinocort  nasal spray if needed for stuffy nose Consider saline nasal rinses as needed for nasal symptoms. Use this before any medicated nasal sprays for best result  Food allergy  Continue to avoid shellfish.  In case of an allergic reaction, take cetirizine 10 mg once every 12-24 hours, and if life-threatening symptoms occur, inject with EpiPen  0.3 mg.   Reflux Continue dietary and lifestyle modifications as listed below Continue famotidine 20 mg twice a day to control reflux Continue with your new medication prescribed by your OB/GYN. Let us  know how your reflux is controlled.  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 Consider penicillin allergy  testing in the future Call the clinic if this treatment plan is not working well for you  Follow up in 1 month or sooner if needed.   Return in about 4 weeks (around 08/23/2024), or if symptoms worsen or fail to improve.    Thank you for the opportunity to care for this patient.  Please do not hesitate to contact me with questions.  Arlean Mutter, FNP [facility]          [1]  Allergies Allergen Reactions   Iodine Anaphylaxis   Shellfish Allergy  Anaphylaxis   Penicillins    "

## 2024-07-26 NOTE — Patient Instructions (Addendum)
 Asthma Continue Symbicort  160-2 puffs twice a day with a spacer to prevent cough or wheeze.   Continue albuterol  2 puffs every 4 hours as needed for cough or wheeze OR Instead use albuterol  0.083% solution via nebulizer one unit vial every 4 hours as needed for cough or wheeze  You may use albuterol  2 puffs 5-15 minutes before activity to decrease cough or wheeze For asthma flare, begin budesonide  0.5 mg via nebulizer twice a day for 1-2 weeks or until cough and wheeze free, then stop  Allergic rhinitis Continue allergen avoidance measures directed towards grass pollen, ragweed pollen, tree pollen, cat, dog, mold, and dust mites as listed below Begin levocetirizine 5 mg once a day if needed for runny nose or itch. This will replace cetirizine for now Consider Rhinocort  nasal spray if needed for stuffy nose Consider saline nasal rinses as needed for nasal symptoms. Use this before any medicated nasal sprays for best result  Food allergy  Continue to avoid  shellfish.  In case of an allergic reaction, take cetirizine 10 mg once every 12-24 hours, and if life-threatening symptoms occur, inject with EpiPen  0.3 mg.   Reflux Continue dietary and lifestyle modifications as listed below Continue famotidine 20 mg twice a day to control reflux Continue with your new medication prescribed by your OB/GYN. Let us  know how your reflux is controlled.  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 Consider penicillin allergy  testing in the future Call the clinic if this treatment plan is not working well for you  Follow up in 1 month or sooner if needed.   Lifestyle Changes for Controlling GERD When you have GERD, stomach acid feels as if its backing up toward your mouth. Whether or not you take medication to control your GERD, your symptoms can often be improved with lifestyle changes.   Raise Your Head Reflux is more likely to strike when youre lying down flat, because stomach fluid can flow backward more easily. Raising the head of your bed 4-6 inches can help. To do this: Slide blocks or books under the legs at the head of your bed. Or, place a wedge under the mattress. Many foam stores can make a suitable wedge for you. The wedge should run from your waist to the top of your head. Dont just prop your head on several pillows. This increases pressure on your stomach. It can make GERD worse.  Watch Your Eating Habits Certain foods may increase the acid in your stomach or relax the lower esophageal sphincter, making GERD more likely. Its best to avoid the following: Coffee, tea, and carbonated drinks (with and without caffeine) Fatty, fried, or spicy food Mint, chocolate, onions, and tomatoes Any other foods that seem to irritate your stomach or cause you pain  Relieve the Pressure Eat smaller meals, even if you have to eat more often. Dont lie down right after you eat. Wait a few hours for your stomach to empty. Avoid tight belts and  tight-fitting clothes. Lose excess weight.  Tobacco and Alcohol Avoid smoking tobacco and drinking alcohol. They can make GERD symptoms worse.    Reducing Pollen Exposure The American [employer] All[employer] , Asthma and Immun[employer] suggests the following steps to reduce your exposure to pollen during allergy  seasons. Do not hang sheets or clothing out to dry; pollen may collect on these items. Do not mow lawns or spend time around freshly cut grass; mowing stirs up pollen. Keep windows closed at night.  Keep car windows closed while driving. Minimize morning activities outdoors, a time when pollen counts are usually at their highest. Stay indoors as much as possible when pollen counts or humidity is high and on windy days when pollen tends to remain in the air longer. Use air conditioning when possible.  Many air conditioners have filters that trap the pollen spores. Use a HEPA room air filter to remove pollen form the indoor air you breathe.    Control of Mold Allergen Mold and fungi can grow on a variety of surfaces provided certain temperature and moisture  conditions exist.  Outdoor molds grow on plants, decaying vegetation and soil.  The major outdoor mold, Alternaria and Cladosporium, are found in very high numbers during hot and dry conditions.  Generally, a late Summer - Fall peak is seen for common outdoor fungal spores.  Rain will temporarily lower outdoor mold spore count, but counts rise rapidly when the rainy period ends.  The most important indoor molds are Aspergillus and Penicillium.  Dark, humid and poorly ventilated basements are ideal sites for mold growth.  The next most common sites of mold growth are the bathroom and the kitchen.  Outdoor [employer] Use air conditioning and keep windows closed Avoid exposure to decaying vegetation. Avoid leaf raking. Avoid grain handling. Consider wearing a face mask if working in moldy areas.  Indoor Mold Control Maintain humidity  below 50%. Clean washable surfaces with 5% bleach solution. Remove sources e.g. Contaminated carpets.  Control of Dust Mite Allergen Dust mites play a major role in allergic asthma and rhinitis. They occur in environments with high humidity wherever human skin is found. Dust mites absorb humidity from the atmosphere (ie, they do not drink) and feed on organic matter (including shed human and animal skin). Dust mites are a microscopic type of insect that you cannot see with the naked eye. High levels of dust mites have been detected from mattresses, pillows, carpets, upholstered furniture, bed covers, clothes, soft toys and any woven material. The principal allergen of the dust mite is found in its feces. A gram of dust may contain 1,000 mites and 250,000 fecal particles. Mite antigen is easily measured in the air during house cleaning activities. Dust mites do not bite and do not cause harm to humans, other than by triggering allergies/asthma.  Ways to decrease your exposure to dust mites in your home:  1. Encase mattresses, box springs and pillows with a mite-impermeable barrier or cover 2. Wash sheets, blankets and drapes weekly in hot water (130 F) with detergent and dry them in a dryer on the hot setting. 3. Have the room cleaned frequently with a vacuum cleaner and a damp dust-mop. For carpeting or rugs, vacuuming with a vacuum cleaner equipped with a high-efficiency particulate air (HEPA) filter. The dust mite allergic individual should not be in a room which is being cleaned and should wait 1 hour after cleaning before going into the room. 4. Do not sleep on upholstered furniture (eg, couches). 5. If possible removing carpeting, upholstered furniture and drapery from the home is ideal. Horizontal blinds should be eliminated in the rooms where the person spends the most time (bedroom, study, television room). Washable vinyl, roller-type shades are optimal. 6. Remove all non-washable stuffed toys  from the bedroom. Wash stuffed toys weekly like sheets and blankets above. 7. Reduce indoor humidity to less than 50%. Inexpensive humidity monitors can be purchased at most hardware stores. Do not use a humidifier as can make the problem worse and are not recommended.  Control of Dog or Cat Allergen Avoidance is the best way to manage a dog or cat allergy . If you have a dog or cat and are allergic to dog or cats, consider removing the dog or cat from the home. If you have a dog or cat but dont want to find it a new home, or if your family wants a pet even though someone in the household is allergic, here are some strategies that may help keep symptoms at bay:  Keep the pet out of your bedroom and  restrict it to only a few rooms. Be advised that keeping the dog or cat in only one room will not limit the allergens to that room. Dont pet, hug or kiss the dog or cat; if you do, wash your hands with soap and water. High-efficiency particulate air (HEPA) cleaners run continuously in a bedroom or living room can reduce allergen levels over time. Regular use of a high-efficiency vacuum cleaner or a central vacuum can reduce allergen levels. Giving your dog or cat a bath at least once a week can reduce airborne allergen.   Allergic rhinitis Continue allergen avoidance measures directed towards grass pollen, ragweed pollen, tree pollen, cat, dog, mold, and dust mites as listed below Continue cetirizine 10 mg once a day if needed for runny nose or itch.  Consider Rhinocort  nasal spray if needed for stuffy nose Consider saline nasal rinses as needed for nasal symptoms. Use this before any medicated nasal sprays for best result  Food allergy  Continue to avoid shellfish.  In case of an allergic reaction, take cetirizine 10 mg once every 12-24 hours, and if life-threatening symptoms occur, inject with EpiPen  0.3 mg.   Call the clinic if this treatment plan is not working well for you  Follow up in 1  month or sooner if needed.   Reducing Pollen Exposure The American Academy of Allergy , Asthma and Immunology suggests the following steps to reduce your exposure to pollen during allergy  seasons. Do not hang sheets or clothing out to dry; pollen may collect on these items. Do not mow lawns or spend time around freshly cut grass; mowing stirs up pollen. Keep windows closed at night.  Keep car windows closed while driving. Minimize morning activities outdoors, a time when pollen counts are usually at their highest. Stay indoors as much as possible when pollen counts or humidity is high and on windy days when pollen tends to remain in the air longer. Use air conditioning when possible.  Many air conditioners have filters that trap the pollen spores. Use a HEPA room air filter to remove pollen form the indoor air you breathe.    Control of Mold Allergen Mold and fungi can grow on a variety of surfaces provided certain temperature and moisture conditions exist.  Outdoor molds grow on plants, decaying vegetation and soil.  The major outdoor mold, Alternaria and Cladosporium, are found in very high numbers during hot and dry conditions.  Generally, a late Summer - Fall peak is seen for common outdoor fungal spores.  Rain will temporarily lower outdoor mold spore count, but counts rise rapidly when the rainy period ends.  The most important indoor molds are Aspergillus and Penicillium.  Dark, humid and poorly ventilated basements are ideal sites for mold growth.  The next most common sites of mold growth are the bathroom and the kitchen.  Outdoor [employer] Use air conditioning and keep windows closed Avoid exposure to decaying vegetation. Avoid leaf raking. Avoid grain handling. Consider wearing a face mask if working in moldy areas.  Indoor Mold Control Maintain humidity below 50%. Clean washable surfaces with 5% bleach solution. Remove sources e.g. Contaminated carpets.  Control of Dust  Mite Allergen Dust mites play a major role in allergic asthma and rhinitis. They occur in environments with high humidity wherever human skin is found. Dust mites absorb humidity from the atmosphere (ie, they do not drink) and feed on organic matter (including shed human and animal skin). Dust mites are a microscopic type of insect that you  cannot see with the naked eye. High levels of dust mites have been detected from mattresses, pillows, carpets, upholstered furniture, bed covers, clothes, soft toys and any woven material. The principal allergen of the dust mite is found in its feces. A gram of dust may contain 1,000 mites and 250,000 fecal particles. Mite antigen is easily measured in the air during house cleaning activities. Dust mites do not bite and do not cause harm to humans, other than by triggering allergies/asthma.  Ways to decrease your exposure to dust mites in your home:  1. Encase mattresses, box springs and pillows with a mite-impermeable barrier or cover 2. Wash sheets, blankets and drapes weekly in hot water (130 F) with detergent and dry them in a dryer on the hot setting. 3. Have the room cleaned frequently with a vacuum cleaner and a damp dust-mop. For carpeting or rugs, vacuuming with a vacuum cleaner equipped with a high-efficiency particulate air (HEPA) filter. The dust mite allergic individual should not be in a room which is being cleaned and should wait 1 hour after cleaning before going into the room. 4. Do not sleep on upholstered furniture (eg, couches). 5. If possible removing carpeting, upholstered furniture and drapery from the home is ideal. Horizontal blinds should be eliminated in the rooms where the person spends the most time (bedroom, study, television room). Washable vinyl, roller-type shades are optimal. 6. Remove all non-washable stuffed toys from the bedroom. Wash stuffed toys weekly like sheets and blankets above. 7. Reduce indoor humidity to less than 50%.  Inexpensive humidity monitors can be purchased at most hardware stores. Do not use a humidifier as can make the problem worse and are not recommended.  Control of Dog or Cat Allergen Avoidance is the best way to manage a dog or cat allergy . If you have a dog or cat and are allergic to dog or cats, consider removing the dog or cat from the home. If you have a dog or cat but dont want to find it a new home, or if your family wants a pet even though someone in the household is allergic, here are some strategies that may help keep symptoms at bay:  Keep the pet out of your bedroom and restrict it to only a few rooms. Be advised that keeping the dog or cat in only one room will not limit the allergens to that room. Dont pet, hug or kiss the dog or cat; if you do, wash your hands with soap and water. High-efficiency particulate air (HEPA) cleaners run continuously in a bedroom or living room can reduce allergen levels over time. Regular use of a high-efficiency vacuum cleaner or a central vacuum can reduce allergen levels. Giving your dog or cat a bath at least once a week can reduce airborne allergen.

## 2024-07-27 ENCOUNTER — Encounter: Payer: Self-pay | Admitting: Family Medicine

## 2024-07-27 NOTE — Telephone Encounter (Signed)
 Thank you for calling back. This medication should really give you good control of your reflux.

## 2024-08-23 ENCOUNTER — Ambulatory Visit: Admitting: Family Medicine
